# Patient Record
Sex: Male | Born: 1945 | Race: White | Hispanic: No | Marital: Single | Smoking: Never smoker
Health system: Southern US, Community
[De-identification: ages and names within clinical notes are randomized; demographics above are authoritative.]

## PROBLEM LIST (undated history)

## (undated) DIAGNOSIS — M19019 Primary osteoarthritis, unspecified shoulder: Secondary | ICD-10-CM

## (undated) DIAGNOSIS — T4145XA Adverse effect of unspecified anesthetic, initial encounter: Secondary | ICD-10-CM

## (undated) DIAGNOSIS — S46819A Strain of other muscles, fascia and tendons at shoulder and upper arm level, unspecified arm, initial encounter: Secondary | ICD-10-CM

## (undated) DIAGNOSIS — Z789 Other specified health status: Secondary | ICD-10-CM

## (undated) DIAGNOSIS — IMO0002 Reserved for concepts with insufficient information to code with codable children: Secondary | ICD-10-CM

## (undated) DIAGNOSIS — B019 Varicella without complication: Secondary | ICD-10-CM

## (undated) DIAGNOSIS — M7540 Impingement syndrome of unspecified shoulder: Secondary | ICD-10-CM

## (undated) DIAGNOSIS — M1990 Unspecified osteoarthritis, unspecified site: Secondary | ICD-10-CM

## (undated) DIAGNOSIS — C61 Malignant neoplasm of prostate: Secondary | ICD-10-CM

## (undated) DIAGNOSIS — T8859XA Other complications of anesthesia, initial encounter: Secondary | ICD-10-CM

## (undated) DIAGNOSIS — I10 Essential (primary) hypertension: Secondary | ICD-10-CM

## (undated) HISTORY — PX: PROSTATE BIOPSY: SHX241

## (undated) HISTORY — PX: OTHER SURGICAL HISTORY: SHX169

## (undated) HISTORY — PX: KNEE ARTHROSCOPY: SHX127

## (undated) HISTORY — DX: Varicella without complication: B01.9

## (undated) HISTORY — PX: COLONOSCOPY: SHX174

---
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
## 1974-09-07 HISTORY — PX: KNEE ARTHROPLASTY: SHX992

## 1989-09-07 HISTORY — PX: CERVICAL FUSION: SHX112

## 1992-09-07 HISTORY — PX: BUNIONECTOMY: SHX129

## 2004-09-25 ENCOUNTER — Encounter: Admission: RE | Admit: 2004-09-25 | Discharge: 2004-09-25 | Payer: Self-pay | Admitting: Family Medicine

## 2006-09-07 HISTORY — PX: SHOULDER SURGERY: SHX246

## 2008-10-23 ENCOUNTER — Encounter: Admission: RE | Admit: 2008-10-23 | Discharge: 2008-10-23 | Payer: Self-pay | Admitting: Family Medicine

## 2008-10-26 ENCOUNTER — Ambulatory Visit: Payer: Self-pay | Admitting: Internal Medicine

## 2008-11-06 ENCOUNTER — Ambulatory Visit: Payer: Self-pay | Admitting: Internal Medicine

## 2012-06-27 ENCOUNTER — Encounter (HOSPITAL_BASED_OUTPATIENT_CLINIC_OR_DEPARTMENT_OTHER): Payer: Self-pay | Admitting: *Deleted

## 2012-06-27 ENCOUNTER — Other Ambulatory Visit: Payer: Self-pay | Admitting: Orthopedic Surgery

## 2012-06-27 NOTE — Progress Notes (Signed)
No cardiac or resp problems 

## 2012-07-01 ENCOUNTER — Encounter (HOSPITAL_BASED_OUTPATIENT_CLINIC_OR_DEPARTMENT_OTHER): Payer: Self-pay | Admitting: Anesthesiology

## 2012-07-01 ENCOUNTER — Encounter (HOSPITAL_BASED_OUTPATIENT_CLINIC_OR_DEPARTMENT_OTHER)
Admission: RE | Disposition: A | Discharge status: Home / Self Care | Payer: Self-pay | Source: Ambulatory Visit | Attending: Orthopedic Surgery

## 2012-07-01 ENCOUNTER — Ambulatory Visit (HOSPITAL_BASED_OUTPATIENT_CLINIC_OR_DEPARTMENT_OTHER): Payer: BC Managed Care – PPO | Admitting: Anesthesiology

## 2012-07-01 ENCOUNTER — Encounter (HOSPITAL_BASED_OUTPATIENT_CLINIC_OR_DEPARTMENT_OTHER): Payer: Self-pay | Admitting: *Deleted

## 2012-07-01 ENCOUNTER — Ambulatory Visit (HOSPITAL_BASED_OUTPATIENT_CLINIC_OR_DEPARTMENT_OTHER)
Admission: RE | Admit: 2012-07-01 | Discharge: 2012-07-01 | Disposition: A | Discharge status: Home / Self Care | Payer: BC Managed Care – PPO | Source: Ambulatory Visit | Attending: Orthopedic Surgery | Admitting: Orthopedic Surgery

## 2012-07-01 DIAGNOSIS — M25819 Other specified joint disorders, unspecified shoulder: Secondary | ICD-10-CM | POA: Insufficient documentation

## 2012-07-01 DIAGNOSIS — S46819A Strain of other muscles, fascia and tendons at shoulder and upper arm level, unspecified arm, initial encounter: Secondary | ICD-10-CM

## 2012-07-01 DIAGNOSIS — M75120 Complete rotator cuff tear or rupture of unspecified shoulder, not specified as traumatic: Secondary | ICD-10-CM | POA: Insufficient documentation

## 2012-07-01 DIAGNOSIS — M19019 Primary osteoarthritis, unspecified shoulder: Secondary | ICD-10-CM

## 2012-07-01 DIAGNOSIS — M66329 Spontaneous rupture of flexor tendons, unspecified upper arm: Secondary | ICD-10-CM | POA: Insufficient documentation

## 2012-07-01 DIAGNOSIS — M7540 Impingement syndrome of unspecified shoulder: Secondary | ICD-10-CM | POA: Diagnosis present

## 2012-07-01 HISTORY — DX: Primary osteoarthritis, unspecified shoulder: M19.019

## 2012-07-01 HISTORY — DX: Reserved for concepts with insufficient information to code with codable children: IMO0002

## 2012-07-01 HISTORY — DX: Impingement syndrome of unspecified shoulder: M75.40

## 2012-07-01 HISTORY — DX: Strain of other muscles, fascia and tendons at shoulder and upper arm level, unspecified arm, initial encounter: S46.819A

## 2012-07-01 HISTORY — DX: Other specified health status: Z78.9

## 2012-07-01 HISTORY — DX: Unspecified osteoarthritis, unspecified site: M19.90

## 2012-07-01 SURGERY — SHOULDER ARTHROSCOPY WITH SUBACROMIAL DECOMPRESSION, ROTATOR CUFF REPAIR AND BICEP TENDON REPAIR
Anesthesia: General | Site: Shoulder | Laterality: Left | Wound class: Clean

## 2012-07-01 MED ORDER — DEXAMETHASONE SODIUM PHOSPHATE 4 MG/ML IJ SOLN
INTRAMUSCULAR | Status: DC | PRN
  Administered 2012-07-01: 10 mg via INTRAVENOUS

## 2012-07-01 MED ORDER — FENTANYL CITRATE 0.05 MG/ML IJ SOLN
50.0000 ug | INTRAMUSCULAR | Status: DC | PRN
  Administered 2012-07-01: 100 ug via INTRAVENOUS

## 2012-07-01 MED ORDER — LIDOCAINE HCL (CARDIAC) 20 MG/ML IV SOLN
INTRAVENOUS | Status: DC | PRN
  Administered 2012-07-01: 50 mg via INTRAVENOUS

## 2012-07-01 MED ORDER — METHOCARBAMOL 500 MG PO TABS: 500.0000 mg | ORAL_TABLET | Freq: Four times a day (QID) | ORAL | Status: DC

## 2012-07-01 MED ORDER — EPHEDRINE SULFATE 50 MG/ML IJ SOLN
INTRAMUSCULAR | Status: DC | PRN
  Administered 2012-07-01: 10 mg via INTRAVENOUS

## 2012-07-01 MED ORDER — LIDOCAINE HCL 4 % MT SOLN
OROMUCOSAL | Status: DC | PRN
  Administered 2012-07-01: 4 mL via TOPICAL

## 2012-07-01 MED ORDER — HYDROMORPHONE HCL PF 1 MG/ML IJ SOLN: 0.2500 mg | INTRAMUSCULAR | Status: DC | PRN

## 2012-07-01 MED ORDER — OXYCODONE HCL 5 MG/5ML PO SOLN: 5.0000 mg | Freq: Once | ORAL | Status: DC | PRN

## 2012-07-01 MED ORDER — DEXAMETHASONE SODIUM PHOSPHATE 10 MG/ML IJ SOLN
INTRAMUSCULAR | Status: DC | PRN
  Administered 2012-07-01: 10 mg

## 2012-07-01 MED ORDER — ONDANSETRON HCL 4 MG/2ML IJ SOLN: 4.0000 mg | Freq: Once | INTRAMUSCULAR | Status: DC | PRN

## 2012-07-01 MED ORDER — MIDAZOLAM HCL 2 MG/2ML IJ SOLN
1.0000 mg | INTRAMUSCULAR | Status: DC | PRN
  Administered 2012-07-01: 2 mg via INTRAVENOUS

## 2012-07-01 MED ORDER — OXYCODONE-ACETAMINOPHEN 10-325 MG PO TABS: 1.0000 | ORAL_TABLET | Freq: Four times a day (QID) | ORAL | Status: DC | PRN

## 2012-07-01 MED ORDER — SODIUM CHLORIDE 0.9 % IR SOLN
Status: DC | PRN
  Administered 2012-07-01: 15000 mL

## 2012-07-01 MED ORDER — CEFAZOLIN SODIUM-DEXTROSE 2-3 GM-% IV SOLR
2.0000 g | INTRAVENOUS | Status: AC
  Administered 2012-07-01: 2 g via INTRAVENOUS

## 2012-07-01 MED ORDER — OXYCODONE HCL 5 MG PO TABS: 5.0000 mg | ORAL_TABLET | Freq: Once | ORAL | Status: DC | PRN

## 2012-07-01 MED ORDER — BUPIVACAINE-EPINEPHRINE PF 0.5-1:200000 % IJ SOLN
INTRAMUSCULAR | Status: DC | PRN
  Administered 2012-07-01: 25 mL

## 2012-07-01 MED ORDER — ONDANSETRON HCL 4 MG/2ML IJ SOLN
INTRAMUSCULAR | Status: DC | PRN
  Administered 2012-07-01: 4 mg via INTRAVENOUS

## 2012-07-01 MED ORDER — PROMETHAZINE HCL 25 MG PO TABS: 25.0000 mg | ORAL_TABLET | Freq: Four times a day (QID) | ORAL | Status: DC | PRN

## 2012-07-01 MED ORDER — PROPOFOL 10 MG/ML IV BOLUS
INTRAVENOUS | Status: DC | PRN
  Administered 2012-07-01: 200 mg via INTRAVENOUS

## 2012-07-01 MED ORDER — LACTATED RINGERS IV SOLN
INTRAVENOUS | Status: DC
Start: 2012-07-01 — End: 2012-07-01
  Administered 2012-07-01 (×2): via INTRAVENOUS

## 2012-07-01 MED ORDER — SUCCINYLCHOLINE CHLORIDE 20 MG/ML IJ SOLN
INTRAMUSCULAR | Status: DC | PRN
  Administered 2012-07-01: 100 mg via INTRAVENOUS

## 2012-07-01 SURGICAL SUPPLY — 71 items
BLADE CUTTER GATOR 3.5 (BLADE) ×2 IMPLANT
BLADE GREAT WHITE 4.2 (BLADE) IMPLANT
BLADE SURG 15 STRL LF DISP TIS (BLADE) IMPLANT
BLADE SURG 15 STRL SS (BLADE)
BUR OVAL 4.0 (BURR) ×2 IMPLANT
BUR OVAL 6.0 (BURR) IMPLANT
CANISTER OMNI JUG 16 LITER (MISCELLANEOUS) ×4 IMPLANT
CANNULA 5.75X71 LONG (CANNULA) ×2 IMPLANT
CANNULA TWIST IN 8.25X7CM (CANNULA) ×2 IMPLANT
CLOTH BEACON ORANGE TIMEOUT ST (SAFETY) ×2 IMPLANT
CUTTER MENISCUS  4.2MM (BLADE)
CUTTER MENISCUS 4.2MM (BLADE) IMPLANT
DECANTER SPIKE VIAL GLASS SM (MISCELLANEOUS) IMPLANT
DRAPE INCISE IOBAN 66X45 STRL (DRAPES) ×2 IMPLANT
DRAPE SHOULDER BEACH CHAIR (DRAPES) ×2 IMPLANT
DRAPE U 20/CS (DRAPES) ×2 IMPLANT
DRAPE U-SHAPE 47X51 STRL (DRAPES) ×2 IMPLANT
DRSG PAD ABDOMINAL 8X10 ST (GAUZE/BANDAGES/DRESSINGS) ×4 IMPLANT
DURAPREP 26ML APPLICATOR (WOUND CARE) ×2 IMPLANT
ELECT REM PT RETURN 9FT ADLT (ELECTROSURGICAL) ×2
ELECTRODE REM PT RTRN 9FT ADLT (ELECTROSURGICAL) ×1 IMPLANT
FIBERSTICK 2 (SUTURE) IMPLANT
GLOVE BIO SURGEON STRL SZ7 (GLOVE) ×2 IMPLANT
GLOVE BIO SURGEON STRL SZ8 (GLOVE) ×2 IMPLANT
GLOVE BIOGEL PI IND STRL 7.0 (GLOVE) ×1 IMPLANT
GLOVE BIOGEL PI IND STRL 8 (GLOVE) ×2 IMPLANT
GLOVE BIOGEL PI INDICATOR 7.0 (GLOVE) ×1
GLOVE BIOGEL PI INDICATOR 8 (GLOVE) ×2
GLOVE ORTHO TXT STRL SZ7.5 (GLOVE) ×2 IMPLANT
GOWN BRE IMP PREV XXLGXLNG (GOWN DISPOSABLE) ×4 IMPLANT
IMMOBILIZER SHOULDER XLGE (ORTHOPEDIC SUPPLIES) IMPLANT
IV NS IRRIG 3000ML ARTHROMATIC (IV SOLUTION) ×12 IMPLANT
KIT BIO-TENODESIS 3X8 DISP (MISCELLANEOUS)
KIT INSRT BABSR STRL DISP BTN (MISCELLANEOUS) IMPLANT
KIT SHOULDER TRACTION (DRAPES) ×2 IMPLANT
LASSO SUT 90 DEGREE (SUTURE) IMPLANT
NDL SUT 6 .5 CRC .975X.05 MAYO (NEEDLE) IMPLANT
NEEDLE MAYO TAPER (NEEDLE)
PACK ARTHROSCOPY DSU (CUSTOM PROCEDURE TRAY) ×2 IMPLANT
PACK BASIN DAY SURGERY FS (CUSTOM PROCEDURE TRAY) ×2 IMPLANT
PENCIL BUTTON HOLSTER BLD 10FT (ELECTRODE) IMPLANT
SET ARTHROSCOPY TUBING (MISCELLANEOUS) ×1
SET ARTHROSCOPY TUBING LN (MISCELLANEOUS) ×1 IMPLANT
SHEET MEDIUM DRAPE 40X70 STRL (DRAPES) ×2 IMPLANT
SLEEVE SCD COMPRESS KNEE MED (MISCELLANEOUS) ×2 IMPLANT
SLING ARM FOAM STRAP LRG (SOFTGOODS) IMPLANT
SLING ARM FOAM STRAP MED (SOFTGOODS) IMPLANT
SLING ARM FOAM STRAP XLG (SOFTGOODS) ×2 IMPLANT
SLING ARM IMMOBILIZER LRG (SOFTGOODS) IMPLANT
SLING ARM IMMOBILIZER MED (SOFTGOODS) IMPLANT
SPONGE GAUZE 4X4 12PLY (GAUZE/BANDAGES/DRESSINGS) ×2 IMPLANT
SPONGE LAP 4X18 X RAY DECT (DISPOSABLE) IMPLANT
STRIP CLOSURE SKIN 1/2X4 (GAUZE/BANDAGES/DRESSINGS) ×2 IMPLANT
SUT FIBERWIRE #2 38 T-5 BLUE (SUTURE)
SUT LASSO 45 DEGREE (SUTURE) IMPLANT
SUT LASSO 45 DEGREE LEFT (SUTURE) IMPLANT
SUT LASSO 45D RIGHT (SUTURE) IMPLANT
SUT MNCRL AB 4-0 PS2 18 (SUTURE) ×2 IMPLANT
SUT PDS AB 1 CT  36 (SUTURE)
SUT PDS AB 1 CT 36 (SUTURE) IMPLANT
SUT PROLENE 0 CT 1 CR/8 (SUTURE) IMPLANT
SUT TIGER TAPE 7 IN WHITE (SUTURE) IMPLANT
SUT VIC AB 3-0 SH 27 (SUTURE)
SUT VIC AB 3-0 SH 27X BRD (SUTURE) IMPLANT
SUT VICRYL 3-0 CR8 SH (SUTURE) ×2 IMPLANT
SUTURE FIBERWR #2 38 T-5 BLUE (SUTURE) IMPLANT
TOWEL OR 17X24 6PK STRL BLUE (TOWEL DISPOSABLE) ×2 IMPLANT
TOWEL OR NON WOVEN STRL DISP B (DISPOSABLE) ×2 IMPLANT
TUBE CONNECTING 20X1/4 (TUBING) IMPLANT
WAND STAR VAC 90 (SURGICAL WAND) ×2 IMPLANT
WATER STERILE IRR 1000ML POUR (IV SOLUTION) ×2 IMPLANT

## 2012-07-01 NOTE — H&P (Signed)
   PREOPERATIVE H&P  Chief Complaint: IMPIGMENT SYNDROME LEFT SHOULDER, COMPLETE RUPTURE OF ROTATOR CUFF  HPI: Alan Hodges is a 66 y.o. male who presents for preoperative history and physical with a diagnosis of IMPIGMENT SYNDROME LEFT SHOULDER, COMPLETE RUPTURE OF ROTATOR CUFF. Symptoms are rated as moderate to severe, and have been worsening.  This is significantly impairing activities of daily living.  He has elected for surgical management.   Past Medical History  Diagnosis Date  . No pertinent past medical history   . Arthritis   . DDD (degenerative disc disease)   . DJD (degenerative joint disease)    Past Surgical History  Procedure Date  . Cervical fusion 1991  . Knee arthroplasty 1976    left open repair  . Knee arthroscopy 40,98,11    right kneex3  . Colonoscopy     x2  . Bunionectomy 1994    right   History   Social History  . Marital Status: Single    Spouse Name: N/A    Number of Children: N/A  . Years of Education: N/A   Social History Main Topics  . Smoking status: Never Smoker   . Smokeless tobacco: None  . Alcohol Use: Yes     daily  . Drug Use: No  . Sexually Active:    Other Topics Concern  . None   Social History Narrative  . None   History reviewed. No pertinent family history. No Known Allergies Prior to Admission medications   Medication Sig Start Date End Date Taking? Authorizing Provider  glucosamine-chondroitin 500-400 MG tablet Take 1 tablet by mouth 3 (three) times daily.   Yes Historical Provider, MD  Multiple Vitamins-Minerals (MULTIVITAMIN WITH MINERALS) tablet Take 1 tablet by mouth daily.   Yes Historical Provider, MD     Positive ROS: All other systems have been reviewed and were otherwise negative with the exception of those mentioned in the HPI and as above.  Physical Exam: General: Alert, no acute distress Cardiovascular: No pedal edema Respiratory: No cyanosis, no use of accessory musculature GI: No  organomegaly, abdomen is soft and non-tender Skin: No lesions in the area of chief complaint Neurologic: Sensation intact distally Psychiatric: Patient is competent for consent with normal mood and affect Lymphatic: No axillary or cervical lymphadenopathy  MUSCULOSKELETAL: Left shoulder has mild pain over the a.c. joint, positive liftoff test, pain anteriorly, weakness with subscapularis testing.  Assessment: IMPIGMENT SYNDROME LEFT SHOULDER, COMPLETE RUPTURE OF ROTATOR CUFF, subscapularis tear with biceps tendinopathy, question dislocation, a.c. joint arthritis  Plan: Plan for Procedure(s): SHOULDER ARTHROSCOPY WITH SUBACROMIAL DECOMPRESSION, ROTATOR CUFF REPAIR AND BICEP TENDON REPAIR, distal clavicle resection, subscapularis repair.  The risks benefits and alternatives were discussed with the patient including but not limited to the risks of nonoperative treatment, versus surgical intervention including infection, bleeding, nerve injury,  blood clots, cardiopulmonary complications, morbidity, mortality, among others, and they were willing to proceed.   Kerah Hardebeck P, MD Cell [phone] Pager [phone]  07/01/2012 7:28 AM

## 2012-07-01 NOTE — Op Note (Signed)
07/01/2012  10:02 AM  PATIENT:  Alan Hodges    PRE-OPERATIVE DIAGNOSIS:  Impingement Syndrome Left Shoulder; complete subscapularis tear, biceps tendon rupture, a.c. joint arthritis  POST-OPERATIVE DIAGNOSIS:  Same  PROCEDURE:  Left shoulder arthroscopy with extensive debridement of biceps tendon and rotator interval and subscapularis remnants, with acromioplasty and distal clavicle resection  SURGEON:  [name], MD  PHYSICIAN ASSISTANT: Janace Litten, OPA-C, present and scrubbed throughout the case, critical for completion in a timely fashion, and for retraction, instrumentation, and closure.  ANESTHESIA:   General  PREOPERATIVE INDICATIONS:  Alan Hodges is a  66 y.o. male a complaint of left shoulder pain, with weakness, difficulty using it. He failed conservative measures and had a preoperative MRI that demonstrated a complete subscapularis rupture with biceps tendon rupture, with retraction of the subscapularis 3 cm.  The risks benefits and alternatives were discussed with the patient preoperatively including but not limited to the risks of infection, bleeding, nerve injury, cardiopulmonary complications, the need for revision surgery, among others, and the patient was willing to proceed. We also did discuss the risks and that the tendon may not be repairable, and can have recurrent rupture, and incomplete relief of symptoms.  OPERATIVE IMPLANTS: None  OPERATIVE FINDINGS: The glenohumeral articular cartilage was in reasonably good condition, with some grade 1 maybe grade 2 changes on the humeral head. The biceps tendon was completely ruptured, and there was a stump of tissue floating within the joint. The subscapularis tendon was no where to be found. This had basically with her to wait to nothing, was severely retracted, and could not be seen, even after complete rotator interval release, dissection down to the coracoid, and mobilization of capsular tissue.  His shoulder  was actually remarkably strict stiff during examination under anesthesia, and he only went up to 160 passively. Even manipulation under anesthesia did not yield more motion.  He had moderate subacromial spurring with an intact rotator cuff superiorly and posteriorly. There was a little bit of fraying underneath the supraspinatus posteriorly, which was debrided. The a.c. joint had degenerative changes.  OPERATIVE PROCEDURE: The patient is brought to the operating room placed in supine position. General anesthesia was administered. IV antibiotics were given. Left upper extremity was examined and had limitations in motion with forward flexion. The left upper extremity was then prepped and draped in usual sterile fashion. Diagnostic was carried out the above-named findings. I used the arthroscopic shaver to debride the remnant stump of the biceps. I then used the ArthriCare to remove the rotator interval tissue down to the level of the coracoid. I mobilized the capsule, and where the subscapularis tendon should have been, however this was retracted, atrophic, and basically nonexistent. There is really no functional tendon to repair. I mobilized down well medial to the glenoid, and had exposure of the coracoid as well as the medial neck of the glenoid, and there was no quality tendon to work with. Therefore I debrided the stump that remained, and went to the subacromial space.  I performed a bursectomy, and the CA ligament release, and a mild acromioplasty. I then resected the distal clavicle removing about 1.1 cm. The rotator cuff was completely intact from the bursal side, although the subscapularis was ruptured as indicated above.  The arthroscopic instruments were then removed and the portals closed with Monocryl followed by Steri-Strips and sterile gauze. He tolerated this well and there were no complications.

## 2012-07-01 NOTE — Anesthesia Postprocedure Evaluation (Signed)
  Anesthesia Post-op Note  Patient: Alan Hodges  Procedure(s) Performed: Procedure(s) (LRB) with comments: SHOULDER ARTHROSCOPY WITH SUBACROMIAL DECOMPRESSION, ROTATOR CUFF REPAIR AND BICEP TENDON REPAIR (Left) - Left Shoulder Arthroscopy, Subacromial Decompression, Partial Acromioplasty with Coracoacromial Release; Debridement Rotator Cuff Tear and Supraspinatus Tear  Patient Location: PACU  Anesthesia Type: GA combined with regional for post-op pain  Level of Consciousness: Awake, alert and oriented  Airway and Oxygen Therapy: Patient Spontanous Breathing and Patient connected to face mask oxygen  Post-op Pain: none  Post-op Assessment: Post-op Vital signs reviewed  Post-op Vital Signs: Reviewed  Complications: No apparent anesthesia complications

## 2012-07-01 NOTE — Progress Notes (Signed)
Assisted Dr. Crews with left, ultrasound guided, interscalene  block. Side rails up, monitors on throughout procedure. See vital signs in flow sheet. Tolerated Procedure well. 

## 2012-07-01 NOTE — Anesthesia Preprocedure Evaluation (Signed)

## 2012-07-01 NOTE — Anesthesia Procedure Notes (Addendum)
Anesthesia Regional Block:  Interscalene brachial plexus block  Pre-Anesthetic Checklist: ,, timeout performed, Correct Patient, Correct Site, Correct Laterality, Correct Procedure, Correct Position, site marked, Risks and benefits discussed,  Surgical consent,  Pre-op evaluation,  At surgeon's request and post-op pain management  Laterality: Left and Upper  Prep: chloraprep       Needles:  Injection technique: Single-shot  Needle Type: Echogenic Needle     Needle Length: 5cm 5 cm Needle Gauge: 21    Additional Needles:  Procedures: ultrasound guided Interscalene brachial plexus block Narrative:  Start time: 07/01/2012 7:10 AM End time: 07/01/2012 7:15 AM Injection made incrementally with aspirations every 5 mL.  Performed by: Personally  Anesthesiologist: Sheldon Silvan  Supraclavicular block Procedure Name: Intubation Date/Time: 07/01/2012 7:46 AM Performed by: Burna Cash Pre-anesthesia Checklist: Patient identified, Emergency Drugs available, Suction available and Patient being monitored Patient Re-evaluated:Patient Re-evaluated prior to inductionOxygen Delivery Method: Circle System Utilized Preoxygenation: Pre-oxygenation with 100% oxygen Intubation Type: IV induction Ventilation: Mask ventilation without difficulty Laryngoscope Size: Mac and 3 Grade View: Grade I Tube type: Oral Tube size: 8.0 mm Number of attempts: 1 Airway Equipment and Method: stylet Placement Confirmation: ETT inserted through vocal cords under direct vision,  positive ETCO2 and breath sounds checked- equal and bilateral Secured at: 22 cm Tube secured with: Tape Dental Injury: Teeth and Oropharynx as per pre-operative assessment

## 2012-07-01 NOTE — Transfer of Care (Signed)
Immediate Anesthesia Transfer of Care Note  Patient: Alan Hodges  Procedure(s) Performed: Procedure(s) (LRB) with comments: SHOULDER ARTHROSCOPY WITH SUBACROMIAL DECOMPRESSION, ROTATOR CUFF REPAIR AND BICEP TENDON REPAIR (Left) - Left Shoulder Arthroscopy, Subacromial Decompression, Partial Acromioplasty with Coracoacromial Release with Rotator Cuff Repair  Patient Location: PACU  Anesthesia Type: GA combined with regional for post-op pain  Level of Consciousness: awake, alert  and oriented  Airway & Oxygen Therapy: Patient Spontanous Breathing and Patient connected to face mask oxygen  Post-op Assessment: Report given to PACU RN and Post -op Vital signs reviewed and stable  Post vital signs: Reviewed and stable  Complications: No apparent anesthesia complications

## 2012-07-06 ENCOUNTER — Encounter (HOSPITAL_BASED_OUTPATIENT_CLINIC_OR_DEPARTMENT_OTHER): Payer: Self-pay

## 2012-12-28 ENCOUNTER — Ambulatory Visit (INDEPENDENT_AMBULATORY_CARE_PROVIDER_SITE_OTHER): Payer: BC Managed Care – PPO | Admitting: Family Medicine

## 2012-12-28 ENCOUNTER — Encounter: Payer: Self-pay | Admitting: Family Medicine

## 2012-12-28 VITALS — BP 140/100 | HR 70 | Temp 98.4°F | Ht 72.0 in | Wt 202.0 lb

## 2012-12-28 DIAGNOSIS — IMO0002 Reserved for concepts with insufficient information to code with codable children: Secondary | ICD-10-CM

## 2012-12-28 DIAGNOSIS — R030 Elevated blood-pressure reading, without diagnosis of hypertension: Secondary | ICD-10-CM

## 2012-12-28 DIAGNOSIS — I10 Essential (primary) hypertension: Secondary | ICD-10-CM | POA: Insufficient documentation

## 2012-12-28 DIAGNOSIS — M170 Bilateral primary osteoarthritis of knee: Secondary | ICD-10-CM | POA: Insufficient documentation

## 2012-12-28 DIAGNOSIS — M1710 Unilateral primary osteoarthritis, unspecified knee: Secondary | ICD-10-CM

## 2012-12-28 DIAGNOSIS — N529 Male erectile dysfunction, unspecified: Secondary | ICD-10-CM | POA: Insufficient documentation

## 2012-12-28 DIAGNOSIS — IMO0001 Reserved for inherently not codable concepts without codable children: Secondary | ICD-10-CM

## 2012-12-28 NOTE — Patient Instructions (Addendum)
Try to avoid > 24 ounces beer per day Monitor blood pressure. Our goal is < 140/90. Consider repeat physical by this August.

## 2012-12-28 NOTE — Progress Notes (Signed)
  Subjective:    Patient ID: Alan Hodges, male    DOB: 02/26/1946, 67 y.o.   MRN: [id number]  HPI Here to establish care. Has previously seen another primary care physician in [place] for 33 years. Recently retired. Forensic scientist. Currently takes no medications. History of elevated blood pressure but not treated with medication. Blood pressures recent borderline elevated. Exercises fairly regularly with cycling. No recent chest pain or headaches.  He's had some problems with both knees with osteoarthritis. Multiple surgeries on both knees. Previous shoulder surgery for rotator cuff injury. Large Baker's cyst right knee  Nonsmoker. 3-4 beers per day. Retired earlier this year.  Family history significant for mother with hypertension and father with type 2 diabetes.  Past Medical History  Diagnosis Date  . No pertinent past medical history   . Arthritis   . DDD (degenerative disc disease)   . DJD (degenerative joint disease)   . Rupture of subscapularis tendon, irreparable 07/01/2012  . Shoulder impingement syndrome, left 07/01/2012  . 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 Chicken pox    Past Surgical History  Procedure Laterality Date  . Cervical fusion  1991  . Knee arthroplasty  1976    left open repair  . Knee arthroscopy  40,98,11    right kneex3  . Colonoscopy      x2  . Bunionectomy  1994    right  . Cataracts Bilateral     reports that he has never smoked. He does not have any smokeless tobacco history on file. He reports that  drinks alcohol. He reports that he does not use illicit drugs. family history includes Diabetes in his father and Hypertension in his mother. No Known Allergies       Review of Systems  Constitutional: Negative for fatigue.  Eyes: Negative for visual disturbance.  Respiratory: Negative for cough, chest tightness and shortness of breath.   Cardiovascular: Negative for chest pain, palpitations and leg swelling.   Musculoskeletal: Positive for arthralgias.  Neurological: Negative for dizziness, syncope, weakness, light-headedness and headaches.       Objective:   Physical Exam  Constitutional: He appears well-developed and well-nourished. No distress.  Neck: Neck supple. No thyromegaly present.  Cardiovascular: Normal rate and regular rhythm.  Exam reveals no gallop.   No murmur heard. Pulmonary/Chest: Effort normal and breath sounds normal. No respiratory distress. He has no wheezes. He has no rales.  Musculoskeletal: He exhibits no edema.  Large nontender Baker's cyst posterior right knee  Neurological: He is alert.          Assessment & Plan:  #1 osteoarthritis both knees. Continue regular cycling. #2 elevated blood pressure. Slightly up today. Continue regular exercise. Reassess at physical in August. Recommend scaling back alcohol to no more than 24 ounces of beer per day #3 history of large Baker's cyst right knee. Mostly asymptomatic. No intervention at this time.

## 2013-04-10 ENCOUNTER — Ambulatory Visit (INDEPENDENT_AMBULATORY_CARE_PROVIDER_SITE_OTHER): Payer: Medicare Other | Admitting: Family Medicine

## 2013-04-10 ENCOUNTER — Encounter: Payer: Self-pay | Admitting: Family Medicine

## 2013-04-10 VITALS — BP 142/82 | HR 72 | Temp 97.9°F | Ht 72.0 in | Wt 192.0 lb

## 2013-04-10 DIAGNOSIS — Z23 Encounter for immunization: Secondary | ICD-10-CM

## 2013-04-10 DIAGNOSIS — R972 Elevated prostate specific antigen [PSA]: Secondary | ICD-10-CM

## 2013-04-10 DIAGNOSIS — Z0000 Encounter for general adult medical examination without abnormal findings: Secondary | ICD-10-CM

## 2013-04-10 DIAGNOSIS — R030 Elevated blood-pressure reading, without diagnosis of hypertension: Secondary | ICD-10-CM

## 2013-04-10 LAB — CBC WITH DIFFERENTIAL/PLATELET
Eosinophils Relative: 3.4 % (ref 0.0–5.0)
HCT: 45.8 % (ref 39.0–52.0)
Hemoglobin: 15.5 g/dL (ref 13.0–17.0)
Lymphs Abs: 2.1 10*3/uL (ref 0.7–4.0)
MCV: 96.6 fl (ref 78.0–100.0)
Monocytes Absolute: 0.5 10*3/uL (ref 0.1–1.0)
Monocytes Relative: 9.1 % (ref 3.0–12.0)
Neutro Abs: 2.4 10*3/uL (ref 1.4–7.7)
Platelets: 252 10*3/uL (ref 150.0–400.0)
RDW: 13 % (ref 11.5–14.6)
WBC: 5.2 10*3/uL (ref 4.5–10.5)

## 2013-04-10 LAB — BASIC METABOLIC PANEL
BUN: 16 mg/dL (ref 6–23)
Calcium: 9.3 mg/dL (ref 8.4–10.5)
Creatinine, Ser: 1.1 mg/dL (ref 0.4–1.5)

## 2013-04-10 LAB — HEPATIC FUNCTION PANEL
AST: 24 U/L (ref 0–37)
Albumin: 4 g/dL (ref 3.5–5.2)
Alkaline Phosphatase: 70 U/L (ref 39–117)
Total Protein: 6.8 g/dL (ref 6.0–8.3)

## 2013-04-10 LAB — LIPID PANEL
Cholesterol: 206 mg/dL — ABNORMAL HIGH (ref 0–200)
Total CHOL/HDL Ratio: 4
Triglycerides: 85 mg/dL (ref 0.0–149.0)

## 2013-04-10 LAB — TSH: TSH: 1.76 u[IU]/mL (ref 0.35–5.50)

## 2013-04-10 LAB — PSA: PSA: 3.99 ng/mL (ref 0.10–4.00)

## 2013-04-10 NOTE — Patient Instructions (Addendum)
Herpes Zoster Virus Vaccine What is this medicine? HERPES ZOSTER VIRUS VACCINE (HUR peez ZOS ter vahy ruhs vak SEEN) is a vaccine. It is used to prevent shingles in adults 65 years old and over. This vaccine is not used to treat shingles or nerve pain from shingles. This medicine may be used for other purposes; ask your health care provider or pharmacist if you have questions. What should I tell my health care provider before I take this medicine? They need to know if you have any of these conditions: -cancer like leukemia or lymphoma -immune system problems or therapy -infection with fever -tuberculosis -an unusual or allergic reaction to vaccines, neomycin, gelatin, other medicines, foods, dyes, or preservatives -pregnant or trying to get pregnant -breast-feeding How should I use this medicine? This vaccine is for injection under the skin. It is given by a health care professional. Talk to your pediatrician regarding the use of this medicine in children. This medicine is not approved for use in children. Overdosage: If you think you have taken too much of this medicine contact a poison control center or emergency room at once. NOTE: This medicine is only for you. Do not share this medicine with others. What if I miss a dose? This does not apply. What may interact with this medicine? Do not take this medicine with any of the following medications: -adalimumab -anakinra -etanercept -infliximab -medicines to treat cancer -medicines that suppress your immune system This medicine may also interact with the following medications: -immunoglobulins -steroid medicines like prednisone or cortisone This list may not describe all possible interactions. Give your health care provider a list of all the medicines, herbs, non-prescription drugs, or dietary supplements you use. Also tell them if you smoke, drink alcohol, or use illegal drugs. Some items may interact with your medicine. What should I  watch for while using this medicine? Visit your doctor for regular check ups. This vaccine, like all vaccines, may not fully protect everyone. After receiving this vaccine it may be possible to pass chickenpox infection to others. Avoid people with immune system problems, pregnant women who have not had chickenpox, and newborns of women who have not had chickenpox. Talk to your doctor for more information. What side effects may I notice from receiving this medicine? Side effects that you should report to your doctor or health care professional as soon as possible: -allergic reactions like skin rash, itching or hives, swelling of the face, lips, or tongue -breathing problems -feeling faint or lightheaded, falls -fever, flu-like symptoms -pain, tingling, numbness in the hands or feet -swelling of the ankles, feet, hands -unusually weak or tired Side effects that usually do not require medical attention (report to your doctor or health care professional if they continue or are bothersome): -aches or pains -chickenpox-like rash -diarrhea -headache -loss of appetite -nausea, vomiting -redness, pain, swelling at site where injected -runny nose This list may not describe all possible side effects. Call your doctor for medical advice about side effects. You may report side effects to FDA at 1-800-FDA-1088. Where should I keep my medicine? This drug is given in a hospital or clinic and will not be stored at home. NOTE: This sheet is a summary. It may not cover all possible information. If you have questions about this medicine, talk to your doctor, pharmacist, or health care provider.  2013, [employer]. (02/10/2010 5:43:50 PM)  

## 2013-04-10 NOTE — Progress Notes (Signed)
Subjective:    Patient ID: Alan Hodges, male    DOB: [birth year], 67 y.o.   MRN: [id number]  HPI Patient here for complete physical Generally very healthy He had orthopedic issues including bilateral knee osteoarthritis and previous neck surgery and rotator cuff surgery. He stays very active. He cycles about 100 miles per week.  Previous colonoscopy March 2010. Last tetanus estimated 6 years ago. No history of shingles vaccine. Requesting this today. Also no history of Pneumovax.  Past Medical History  Diagnosis Date  . No pertinent past medical history   . Arthritis   . DDD (degenerative disc disease)   . DJD (degenerative joint disease)   . Rupture of subscapularis tendon, irreparable 07/01/2012  . Shoulder impingement syndrome, left 07/01/2012  . 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 Chicken pox    Past Surgical History  Procedure Laterality Date  . Cervical fusion  1991  . Knee arthroplasty  1976    left open repair  . Knee arthroscopy  95,62,13    right kneex3  . Colonoscopy      x2  . Bunionectomy  1994    right  . Cataracts Bilateral     reports that he has never smoked. He does not have any smokeless tobacco history on file. He reports that  drinks alcohol. He reports that he does not use illicit drugs. family history includes Diabetes in his father and Hypertension in his mother. No Known Allergies    Review of Systems  Constitutional: Negative for fever, activity change, appetite change and fatigue.  HENT: Negative for ear pain, congestion and trouble swallowing.   Eyes: Negative for pain and visual disturbance.  Respiratory: Negative for cough, shortness of breath and wheezing.   Cardiovascular: Negative for chest pain and palpitations.  Gastrointestinal: Negative for nausea, vomiting, abdominal pain, diarrhea, constipation, blood in stool, abdominal distention and rectal pain.  Genitourinary: Negative for dysuria, hematuria and testicular  pain.  Musculoskeletal: Positive for arthralgias. Negative for joint swelling.  Skin: Negative for rash.  Neurological: Negative for dizziness, syncope and headaches.  Hematological: Negative for adenopathy.  Psychiatric/Behavioral: Negative for confusion and dysphoric mood.       Objective:   Physical Exam  Constitutional: He is oriented to person, place, and time. He appears well-developed and well-nourished. No distress.  HENT:  Head: Normocephalic and atraumatic.  Right Ear: External ear normal.  Left Ear: External ear normal.  Mouth/Throat: Oropharynx is clear and moist.  Eyes: Conjunctivae and EOM are normal. Pupils are equal, round, and reactive to light.  Neck: Normal range of motion. Neck supple. No thyromegaly present.  Cardiovascular: Normal rate, regular rhythm and normal heart sounds.   No murmur heard. Pulmonary/Chest: No respiratory distress. He has no wheezes. He has no rales.  Abdominal: Soft. Bowel sounds are normal. He exhibits no distension and no mass. There is no tenderness. There is no rebound and no guarding.  Genitourinary:  Prostate minimally enlarged. No nodules. Nontender.  Musculoskeletal: He exhibits no edema.  Lymphadenopathy:    He has no cervical adenopathy.  Neurological: He is alert and oriented to person, place, and time. He displays normal reflexes. No cranial nerve deficit.  Skin: No rash noted.  Psychiatric: He has a normal mood and affect.          Assessment & Plan:  Complete physical. Pneumovax given. Patient requesting shingles vaccine. No contraindications. Obtain screening lab work. Continue regular exercise habits. He has already lost 10 pounds since last visit  and he attributes to exercise. Confirm date of last tetanus. Colonoscopy up to date. He is requesting PSA after discussion of pros and cons

## 2013-04-11 ENCOUNTER — Encounter: Payer: Self-pay | Admitting: Family Medicine

## 2013-04-11 NOTE — Addendum Note (Signed)
Addended by: Kristian Covey on: 04/11/2013 08:11 AM   Modules accepted: Orders

## 2013-07-13 ENCOUNTER — Other Ambulatory Visit: Payer: Self-pay

## 2013-10-17 ENCOUNTER — Other Ambulatory Visit (INDEPENDENT_AMBULATORY_CARE_PROVIDER_SITE_OTHER): Payer: Medicare Other

## 2013-10-17 DIAGNOSIS — R972 Elevated prostate specific antigen [PSA]: Secondary | ICD-10-CM

## 2013-10-17 LAB — PSA: PSA: 3.47 ng/mL (ref 0.10–4.00)

## 2013-10-18 ENCOUNTER — Other Ambulatory Visit: Payer: Self-pay | Admitting: Family Medicine

## 2013-10-18 DIAGNOSIS — R972 Elevated prostate specific antigen [PSA]: Secondary | ICD-10-CM

## 2014-04-11 ENCOUNTER — Encounter: Payer: Medicare Other | Admitting: Family Medicine

## 2014-05-02 ENCOUNTER — Ambulatory Visit (INDEPENDENT_AMBULATORY_CARE_PROVIDER_SITE_OTHER): Payer: Medicare Other | Admitting: Family Medicine

## 2014-05-02 ENCOUNTER — Encounter: Payer: Self-pay | Admitting: Family Medicine

## 2014-05-02 VITALS — BP 140/80 | HR 70 | Temp 97.7°F | Ht 72.0 in | Wt 186.0 lb

## 2014-05-02 DIAGNOSIS — Z0000 Encounter for general adult medical examination without abnormal findings: Secondary | ICD-10-CM

## 2014-05-02 LAB — BASIC METABOLIC PANEL
BUN: 20 mg/dL (ref 6–23)
CHLORIDE: 106 meq/L (ref 96–112)
CO2: 24 mEq/L (ref 19–32)
Calcium: 9 mg/dL (ref 8.4–10.5)
Creatinine, Ser: 0.9 mg/dL (ref 0.4–1.5)
GFR: 85.31 mL/min (ref >60.00)
Glucose, Bld: 90 mg/dL (ref 70–99)
POTASSIUM: 3.9 meq/L (ref 3.5–5.1)
SODIUM: 138 meq/L (ref 135–145)

## 2014-05-02 LAB — CBC WITH DIFFERENTIAL/PLATELET
BASOS PCT: 0.4 % (ref 0.0–3.0)
Basophils Absolute: 0 10*3/uL (ref 0.0–0.1)
EOS ABS: 0.2 10*3/uL (ref 0.0–0.7)
Eosinophils Relative: 3.4 % (ref 0.0–5.0)
HCT: 43 % (ref 39.0–52.0)
Hemoglobin: 14.6 g/dL (ref 13.0–17.0)
LYMPHS ABS: 2.1 10*3/uL (ref 0.7–4.0)
Lymphocytes Relative: 38.6 % (ref 12.0–46.0)
MCHC: 33.8 g/dL (ref 30.0–36.0)
MCV: 95.3 fl (ref 78.0–100.0)
MONO ABS: 0.4 10*3/uL (ref 0.1–1.0)
Monocytes Relative: 7.6 % (ref 3.0–12.0)
NEUTROS PCT: 50 % (ref 43.0–77.0)
Neutro Abs: 2.7 10*3/uL (ref 1.4–7.7)
PLATELETS: 240 10*3/uL (ref 150.0–400.0)
RBC: 4.51 Mil/uL (ref 4.22–5.81)
RDW: 12.8 % (ref 11.5–15.5)
WBC: 5.3 10*3/uL (ref 4.0–10.5)

## 2014-05-02 LAB — PSA: PSA: 4.23 ng/mL — AB (ref 0.10–4.00)

## 2014-05-02 LAB — LIPID PANEL
CHOLESTEROL: 195 mg/dL (ref 0–200)
HDL: 52.1 mg/dL (ref >39.00)
LDL CALC: 118 mg/dL — AB (ref 0–99)
NonHDL: 142.9
TRIGLYCERIDES: 125 mg/dL (ref 0.0–149.0)
Total CHOL/HDL Ratio: 4
VLDL: 25 mg/dL (ref 0.0–40.0)

## 2014-05-02 LAB — HEPATIC FUNCTION PANEL
ALBUMIN: 3.8 g/dL (ref 3.5–5.2)
ALT: 17 U/L (ref 0–53)
AST: 21 U/L (ref 0–37)
Alkaline Phosphatase: 60 U/L (ref 39–117)
BILIRUBIN TOTAL: 1 mg/dL (ref 0.2–1.2)
Bilirubin, Direct: 0.1 mg/dL (ref 0.0–0.3)
Total Protein: 6.6 g/dL (ref 6.0–8.3)

## 2014-05-02 LAB — TSH: TSH: 1.56 u[IU]/mL (ref 0.35–4.50)

## 2014-05-02 NOTE — Patient Instructions (Signed)
Remember flu vaccine this Fall.   

## 2014-05-02 NOTE — Progress Notes (Signed)
Subjective:    Patient ID: Alan Hodges, male    DOB: Nov 11, 1945, 68 y.o.   MRN: [id number]  HPI Patient here for complete physical. Generally very healthy. He cycles about 100 miles week. No recent chest pains. No recent injuries. Tetanus 2008. Colonoscopy 2010. He received shingles vaccine and pneumococcal vaccine last year. He plans to get flu vaccine later this fall. Takes no regular medications. 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 Pain is not a major limiting factor at this time  Past Medical History  Diagnosis Date  . No pertinent past medical history   . Arthritis   . DDD (degenerative disc disease)   . DJD (degenerative joint disease)   . Rupture of subscapularis tendon, irreparable 07/01/2012  . Shoulder impingement syndrome, left 07/01/2012  . 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 Chicken pox    Past Surgical History  Procedure Laterality Date  . Cervical fusion  1991  . Knee arthroplasty  1976    left open repair  . Knee arthroscopy  30,09,23    right kneex3  . Colonoscopy      x2  . Bunionectomy  1994    right  . Cataracts Bilateral     reports that he has never smoked. He does not have any smokeless tobacco history on file. He reports that he drinks alcohol. He reports that he does not use illicit drugs. family history includes Diabetes in his father; Hypertension in his mother. No Known Allergies    Review of Systems  Constitutional: Negative for fever, activity change, appetite change and fatigue.  HENT: Negative for congestion, ear pain and trouble swallowing.   Eyes: Negative for pain and visual disturbance.  Respiratory: Negative for cough, shortness of breath and wheezing.   Cardiovascular: Negative for chest pain and palpitations.  Gastrointestinal: Negative for nausea, vomiting, abdominal pain, diarrhea, constipation, blood in stool, abdominal distention and  rectal pain.  Genitourinary: Negative for dysuria, hematuria and testicular pain.  Musculoskeletal: Negative for arthralgias and joint swelling.  Skin: Negative for rash.  Neurological: Negative for dizziness, syncope and headaches.  Hematological: Negative for adenopathy.  Psychiatric/Behavioral: Negative for confusion and dysphoric mood.       Objective:   Physical Exam  Constitutional: He is oriented to person, place, and time. He appears well-developed and well-nourished. No distress.  HENT:  Head: Normocephalic and atraumatic.  Right Ear: External ear normal.  Left Ear: External ear normal.  Mouth/Throat: Oropharynx is clear and moist.  Eyes: Conjunctivae and EOM are normal. Pupils are equal, round, and reactive to light.  Neck: Normal range of motion. Neck supple. No thyromegaly present.  Cardiovascular: Normal rate, regular rhythm and normal heart sounds.   No murmur heard. Pulmonary/Chest: No respiratory distress. He has no wheezes. He has no rales.  Abdominal: Soft. Bowel sounds are normal. He exhibits no distension and no mass. There is no tenderness. There is no rebound and no guarding.  Genitourinary:  Prostate is slightly enlarged but symmetric with no nodules noted. No rectal mass  Musculoskeletal: He exhibits no edema.  Lymphadenopathy:    He has no cervical adenopathy.  Neurological: He is alert and oriented to person, place, and time. He displays normal reflexes. No cranial nerve deficit.  Skin: No rash noted.  Psychiatric: He has a normal mood and affect.          Assessment & Plan:  Complete physical. Tetanus booster in 2 years.  He wishes to return later for flu vaccine. Colonoscopy 2010. Received previous pneumonia vaccine and shingles vaccine. Continue regular exercise habits. Obtain screening labs. Patient requests PSA after discussion of risk and benefits.

## 2014-05-02 NOTE — Progress Notes (Signed)
Pre visit review using our clinic review tool, if applicable. No additional management support is needed unless otherwise documented below in the visit note. 

## 2014-06-15 ENCOUNTER — Encounter: Payer: Self-pay | Admitting: Internal Medicine

## 2014-08-27 ENCOUNTER — Other Ambulatory Visit (INDEPENDENT_AMBULATORY_CARE_PROVIDER_SITE_OTHER): Payer: Medicare Other

## 2014-08-27 DIAGNOSIS — R972 Elevated prostate specific antigen [PSA]: Secondary | ICD-10-CM

## 2014-08-28 LAB — PSA: PSA: 4.79 ng/mL — AB (ref 0.10–4.00)

## 2014-08-29 ENCOUNTER — Telehealth: Payer: Self-pay | Admitting: *Deleted

## 2014-08-29 NOTE — Telephone Encounter (Signed)
Spoke with patient and he will call urology.

## 2015-01-29 ENCOUNTER — Other Ambulatory Visit: Payer: Self-pay | Admitting: Urology

## 2015-01-29 DIAGNOSIS — C61 Malignant neoplasm of prostate: Secondary | ICD-10-CM

## 2015-03-28 ENCOUNTER — Ambulatory Visit (INDEPENDENT_AMBULATORY_CARE_PROVIDER_SITE_OTHER)
Admission: RE | Admit: 2015-03-28 | Discharge: 2015-03-28 | Disposition: A | Discharge status: Home / Self Care | Payer: Medicare Other | Source: Ambulatory Visit | Attending: Family Medicine | Admitting: Family Medicine

## 2015-03-28 ENCOUNTER — Encounter: Payer: Self-pay | Admitting: Family Medicine

## 2015-03-28 ENCOUNTER — Ambulatory Visit (INDEPENDENT_AMBULATORY_CARE_PROVIDER_SITE_OTHER): Payer: Medicare Other | Admitting: Family Medicine

## 2015-03-28 VITALS — BP 130/90 | HR 68 | Temp 98.3°F | Wt 182.0 lb

## 2015-03-28 DIAGNOSIS — M25572 Pain in left ankle and joints of left foot: Secondary | ICD-10-CM

## 2015-03-28 NOTE — Progress Notes (Signed)
   Subjective:    Patient ID: Alan Hodges, male    DOB: Nov 20, 1945, 69 y.o.   MRN: [id number]  HPI Acute visit for left ankle pain. Injury occurred on Tuesday evening. His girlfriend accidentally stepped on his ankle and rotated with pressure onto his lower leg and ankle. He describes probable eversion type injury. He has had some lateral ankle tenderness since then and tenderness over the distal fibula. He is walking with a limp. He has been taking ibuprofen and icing and elevating. Ace wrap for compression. Denies any medial ankle pain  Past Medical History  Diagnosis Date  . No pertinent past medical history   . Arthritis   . DDD (degenerative disc disease)   . DJD (degenerative joint disease)   . Rupture of subscapularis tendon, irreparable 07/01/2012  . Shoulder impingement syndrome, left 07/01/2012  . 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 Chicken pox    Past Surgical History  Procedure Laterality Date  . Cervical fusion  1991  . Knee arthroplasty  1976    left open repair  . Knee arthroscopy  10,93,23    right kneex3  . Colonoscopy      x2  . Bunionectomy  1994    right  . Cataracts Bilateral     reports that he has never smoked. He does not have any smokeless tobacco history on file. He reports that he drinks alcohol. He reports that he does not use illicit drugs. family history includes Diabetes in his father; Hypertension in his mother. No Known Allergies    Review of Systems  Neurological: Negative for weakness and numbness.       Objective:   Physical Exam  Constitutional: He appears well-developed and well-nourished.  Cardiovascular: Normal rate and regular rhythm.   Pulmonary/Chest: Effort normal and breath sounds normal. No respiratory distress. He has no wheezes. He has no rales.  Musculoskeletal:  Left ankle reveals some swelling laterally with some ecchymosis along the inferior aspect of ankle down to the lateral aspect of the left  foot. No Achilles tenderness. No medial tenderness. Tenderness over the distal fibula. He has pain with eversion and inversion. He is able to plantarflex and dorsiflex without difficulty He does not have any tenderness over the proximal left fifth metatarsal          Assessment & Plan:  Left ankle pain following injury. He has tenderness over the distal fibula and needs x-ray for further evaluation. Rule out distal fibular fracture Order placed. Continue ice, elevation, ibuprofen, and Ace wrap

## 2015-03-28 NOTE — Progress Notes (Signed)
Pre visit review using our clinic review tool, if applicable. No additional management support is needed unless otherwise documented below in the visit note. 

## 2015-03-28 NOTE — Patient Instructions (Signed)

## 2015-03-29 ENCOUNTER — Ambulatory Visit (HOSPITAL_COMMUNITY)
Admission: RE | Admit: 2015-03-29 | Discharge: 2015-03-29 | Disposition: A | Discharge status: Home / Self Care | Payer: Medicare Other | Source: Ambulatory Visit | Attending: Urology | Admitting: Urology

## 2015-03-29 DIAGNOSIS — R972 Elevated prostate specific antigen [PSA]: Secondary | ICD-10-CM | POA: Diagnosis not present

## 2015-03-29 DIAGNOSIS — C61 Malignant neoplasm of prostate: Secondary | ICD-10-CM | POA: Insufficient documentation

## 2015-03-29 LAB — POCT I-STAT CREATININE: Creatinine, Ser: 1 mg/dL (ref 0.61–1.24)

## 2015-03-29 MED ORDER — GADOBENATE DIMEGLUMINE 529 MG/ML IV SOLN
20.0000 mL | Freq: Once | INTRAVENOUS | Status: AC | PRN
  Administered 2015-03-29: 17 mL via INTRAVENOUS

## 2015-09-27 ENCOUNTER — Ambulatory Visit (INDEPENDENT_AMBULATORY_CARE_PROVIDER_SITE_OTHER): Payer: Medicare Other | Admitting: Family Medicine

## 2015-09-27 ENCOUNTER — Encounter: Payer: Self-pay | Admitting: Family Medicine

## 2015-09-27 VITALS — BP 130/94 | HR 77 | Temp 97.8°F | Ht 71.25 in | Wt 192.0 lb

## 2015-09-27 DIAGNOSIS — C61 Malignant neoplasm of prostate: Secondary | ICD-10-CM | POA: Diagnosis not present

## 2015-09-27 DIAGNOSIS — Z23 Encounter for immunization: Secondary | ICD-10-CM | POA: Diagnosis not present

## 2015-09-27 DIAGNOSIS — Z0000 Encounter for general adult medical examination without abnormal findings: Secondary | ICD-10-CM | POA: Diagnosis not present

## 2015-09-27 LAB — HEPATIC FUNCTION PANEL
ALBUMIN: 4.2 g/dL (ref 3.5–5.2)
ALK PHOS: 65 U/L (ref 39–117)
ALT: 19 U/L (ref 0–53)
AST: 23 U/L (ref 0–37)
BILIRUBIN TOTAL: 1.1 mg/dL (ref 0.2–1.2)
Bilirubin, Direct: 0.2 mg/dL (ref 0.0–0.3)
Total Protein: 6.8 g/dL (ref 6.0–8.3)

## 2015-09-27 LAB — CBC WITH DIFFERENTIAL/PLATELET
Basophils Absolute: 0 10*3/uL (ref 0.0–0.1)
Basophils Relative: 0.3 % (ref 0.0–3.0)
EOS ABS: 0.1 10*3/uL (ref 0.0–0.7)
EOS PCT: 1.7 % (ref 0.0–5.0)
HCT: 45.8 % (ref 39.0–52.0)
HEMOGLOBIN: 15.3 g/dL (ref 13.0–17.0)
LYMPHS ABS: 2.5 10*3/uL (ref 0.7–4.0)
Lymphocytes Relative: 38 % (ref 12.0–46.0)
MCHC: 33.5 g/dL (ref 30.0–36.0)
MCV: 94.8 fl (ref 78.0–100.0)
MONO ABS: 0.5 10*3/uL (ref 0.1–1.0)
Monocytes Relative: 7.2 % (ref 3.0–12.0)
NEUTROS PCT: 52.8 % (ref 43.0–77.0)
Neutro Abs: 3.5 10*3/uL (ref 1.4–7.7)
Platelets: 263 10*3/uL (ref 150.0–400.0)
RBC: 4.82 Mil/uL (ref 4.22–5.81)
RDW: 12.5 % (ref 11.5–15.5)
WBC: 6.6 10*3/uL (ref 4.0–10.5)

## 2015-09-27 LAB — LIPID PANEL
CHOLESTEROL: 203 mg/dL — AB (ref 0–200)
HDL: 53.7 mg/dL (ref >39.00)
LDL CALC: 129 mg/dL — AB (ref 0–99)
NONHDL: 149.06
Total CHOL/HDL Ratio: 4
Triglycerides: 100 mg/dL (ref 0.0–149.0)
VLDL: 20 mg/dL (ref 0.0–40.0)

## 2015-09-27 LAB — BASIC METABOLIC PANEL
BUN: 18 mg/dL (ref 6–23)
CALCIUM: 9.2 mg/dL (ref 8.4–10.5)
CO2: 28 meq/L (ref 19–32)
CREATININE: 1.06 mg/dL (ref 0.40–1.50)
Chloride: 102 mEq/L (ref 96–112)
GFR: 73.95 mL/min (ref >60.00)
Glucose, Bld: 87 mg/dL (ref 70–99)
Potassium: 3.9 mEq/L (ref 3.5–5.1)
SODIUM: 139 meq/L (ref 135–145)

## 2015-09-27 LAB — PSA: PSA: 5.19 ng/mL — AB (ref 0.10–4.00)

## 2015-09-27 LAB — TSH: TSH: 1.33 u[IU]/mL (ref 0.35–4.50)

## 2015-09-27 NOTE — Progress Notes (Signed)
Pre visit review using our clinic review tool, if applicable. No additional management support is needed unless otherwise documented below in the visit note. 

## 2015-09-27 NOTE — Progress Notes (Signed)
   Subjective:    Patient ID: Alan Hodges, male    DOB: 20-Feb-1946, 70 y.o.   MRN: [id number]  HPI Patient here for physical exam. Last year was diagnosed with prostate cancer. His last biopsy was last July. Gleason score 6. He takes no medications. He exercises almost daily. Already had flu vaccine. Needs Prevnar 13. No chest pains. No dysuria. No change in stool habits. Colonoscopy up-to-date.  Past Medical History  Diagnosis Date  . No pertinent past medical history   . Arthritis   . DDD (degenerative disc disease)   . DJD (degenerative joint disease)   . Rupture of subscapularis tendon, irreparable 07/01/2012  . Shoulder impingement syndrome, left 07/01/2012  . 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 Chicken pox    Past Surgical History  Procedure Laterality Date  . Cervical fusion  1991  . Knee arthroplasty  1976    left open repair  . Knee arthroscopy  [id number]    right kneex3  . Colonoscopy      x2  . Bunionectomy  1994    right  . Cataracts Bilateral     reports that he has never smoked. He does not have any smokeless tobacco history on file. He reports that he drinks alcohol. He reports that he does not use illicit drugs. family history includes Diabetes in his father; Hypertension in his mother. No Known Allergies    Review of Systems  Constitutional: Negative for fever, activity change, appetite change and fatigue.  HENT: Negative for congestion, ear pain and trouble swallowing.   Eyes: Negative for pain and visual disturbance.  Respiratory: Negative for cough, shortness of breath and wheezing.   Cardiovascular: Negative for chest pain and palpitations.  Gastrointestinal: Negative for nausea, vomiting, abdominal pain, diarrhea, constipation, blood in stool, abdominal distention and rectal pain.  Genitourinary: Negative for dysuria, hematuria and testicular pain.  Musculoskeletal: Positive for arthralgias. Negative for joint swelling.  Skin:  Negative for rash.  Neurological: Negative for dizziness, syncope and headaches.  Hematological: Negative for adenopathy.  Psychiatric/Behavioral: Negative for confusion and dysphoric mood.       Objective:   Physical Exam  Constitutional: He is oriented to person, place, and time. He appears well-developed and well-nourished. No distress.  HENT:  Head: Normocephalic and atraumatic.  Right Ear: External ear normal.  Left Ear: External ear normal.  Mouth/Throat: Oropharynx is clear and moist.  Eyes: Conjunctivae and EOM are normal. Pupils are equal, round, and reactive to light.  Neck: Normal range of motion. Neck supple. No thyromegaly present.  Cardiovascular: Normal rate, regular rhythm and normal heart sounds.   No murmur heard. Pulmonary/Chest: No respiratory distress. He has no wheezes. He has no rales.  Abdominal: Soft. Bowel sounds are normal. He exhibits no distension and no mass. There is no tenderness. There is no rebound and no guarding.  Musculoskeletal: He exhibits no edema.  Lymphadenopathy:    He has no cervical adenopathy.  Neurological: He is alert and oriented to person, place, and time. He displays normal reflexes. No cranial nerve deficit.  Skin: No rash noted.  Psychiatric: He has a normal mood and affect.          Assessment & Plan:  Physical exam. Prevnar 13 given. Obtain screening lab work. Patient requesting repeat PSA. We will fax results to his urologist. Continue regular exercise habits. Blood pressure borderline elevated today. Monitor closely and be in touch if consistent readings over 150/90

## 2015-09-27 NOTE — Patient Instructions (Signed)
Monitor blood pressure and be in touch if consistently > 150/90 

## 2015-11-29 DIAGNOSIS — N400 Enlarged prostate without lower urinary tract symptoms: Secondary | ICD-10-CM | POA: Diagnosis not present

## 2015-11-29 DIAGNOSIS — C61 Malignant neoplasm of prostate: Secondary | ICD-10-CM | POA: Diagnosis not present

## 2015-11-29 DIAGNOSIS — Z0000 Encounter for general adult medical examination without abnormal findings: Secondary | ICD-10-CM | POA: Diagnosis not present

## 2016-05-28 DIAGNOSIS — H524 Presbyopia: Secondary | ICD-10-CM | POA: Diagnosis not present

## 2016-05-29 DIAGNOSIS — C61 Malignant neoplasm of prostate: Secondary | ICD-10-CM | POA: Diagnosis not present

## 2016-06-03 DIAGNOSIS — C61 Malignant neoplasm of prostate: Secondary | ICD-10-CM | POA: Diagnosis not present

## 2016-08-04 ENCOUNTER — Ambulatory Visit (INDEPENDENT_AMBULATORY_CARE_PROVIDER_SITE_OTHER): Payer: Medicare Other

## 2016-08-04 VITALS — BP 140/90 | HR 70 | Ht 72.0 in | Wt 191.5 lb

## 2016-08-04 DIAGNOSIS — Z0000 Encounter for general adult medical examination without abnormal findings: Secondary | ICD-10-CM

## 2016-08-04 DIAGNOSIS — Z7289 Other problems related to lifestyle: Secondary | ICD-10-CM | POA: Diagnosis not present

## 2016-08-04 DIAGNOSIS — Z23 Encounter for immunization: Secondary | ICD-10-CM | POA: Diagnosis not present

## 2016-08-04 NOTE — Progress Notes (Signed)
Subjective:   Alan Hodges is a 70 y.o. male who presents for an Initial Medicare Annual Wellness Visit.  The Patient was informed that the wellness visit is to identify future health risk and educate and initiate measures that can reduce risk for increased disease through the lifespan.    NO ROS; Medicare Wellness Visit  Psycho social [place] and [place] move to [place] on off season Has GF Grows roses; has 40 at home Will be gardening in the [employer] as good, fair or great? good  Preventive Screening -Counseling & Management  Last CPE; 09/27/2015 PSA followed by Dr Diona Fanti - next scheduled in 2 years for bx But checks annually    Current smoking/ tobacco status/ never smoked  Second Hand Smoke status; No Smokers in the home  ETOH Daily/ 3 per day; beers  Recommendation is for 2 per day  RISK FACTORS Regular exercise / works his gardens [employer] to bike; rides on the road;  Rides away from people; 40 mile  Has learned to take hydration with him  Works all the time in the yard Is strong and muscular but states this is due to work  Diet:  Focus on fish; seafood  2 to 3 times per week Infrequent red meat; chicken; no pork Salads;  Yogurt and fruit  Balanced with fruits and some vegetables   Fall risk ; no falls Mobility of Functional changes this year? No Noticed the pain in neck gets worse every year with hx of fusion at 70yo Issues with working overhead    Cardiac Risk Factors: No history Advanced aged > 68 in men; Hyperlipidemia; LDL 129  Diabetes negative  Family History  Mother had HTN; Father had DM  Mother is 16 years; father expired 64    Eye exam/ a few months ago Cataract surgery; now does not wear contacts  Dr Bing Plume   Depression Screen PhQ 2: negative  Activities of Daily Living - See functional screen   Cognitive testing; Ad8 score; 0 or less than 2  MMSE deferred or completed if AD8 + 2  issues  Colonoscopy: 11/2008/ due 2020   Advanced Directives / completed and not in the chart  Will bring it in and let Dr. Elease Hashimoto   List the name of Physicians or other Practitioners you currently use:  Dr Diona Fanti following PSA   Immunization History  Administered Date(s) Administered  . Influenza-Unspecified 08/23/2014, 07/09/2015  . Pneumococcal Conjugate-13 09/27/2015  . Pneumococcal Polysaccharide-23 04/10/2013  . Tdap 04/11/2007  . Zoster 04/10/2013   Required Immunizations needed today  Screening test up to date or reviewed for plan of completion Health Maintenance Due  Topic Date Due  . Hepatitis C Screening  09/17/1945  . INFLUENZA VACCINE  04/07/2016   Cardiac Risk Factors include: advanced age (>53men, >55 women);male genderGave blood in 2012; and gave 20 units overtime Did have a GF with non AB hep  Will agee with hep c   Will take flu vaccine    Objective:    Today's Vitals   08/04/16 0808  BP: 140/90  Pulse: 70  SpO2: 98%  Weight: 191 lb 8 oz (86.9 kg)  Height: 6' (1.829 m)   Body mass index is 25.97 kg/m.  Current Medications (verified) Outpatient Encounter Prescriptions as of 08/04/2016  Medication Sig  . ibuprofen (ADVIL,MOTRIN) 200 MG tablet Take 200 mg by mouth every 8 (eight) hours as needed (just as needed).   No facility-administered  encounter medications on file as of 08/04/2016.     Allergies (verified) Patient has no known allergies.   History: Past Medical History:  Diagnosis Date  . 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 Arthritis   . Chicken pox   . DDD (degenerative disc disease)   . DJD (degenerative joint disease)   . No pertinent past medical history   . Rupture of subscapularis tendon, irreparable 07/01/2012  . Shoulder impingement syndrome, left 07/01/2012   Past Surgical History:  Procedure Laterality Date  . [place]   right  . cataracts Bilateral   . CERVICAL FUSION  1991  .  COLONOSCOPY     x2  . KNEE ARTHROPLASTY  1976   left open repair  . KNEE ARTHROSCOPY  [id number]   right kneex3   Family History  Problem Relation Age of Onset  . Hypertension Mother   . Diabetes Father    Social History   Occupational History  . Not on file.   Social History Main Topics  . Smoking status: Never Smoker  . Smokeless tobacco: Never Used  . Alcohol use Yes     Comment: daily  . Drug use: No  . Sexual activity: Not on file   Tobacco Counseling Counseling given: Yes   Activities of Daily Living In your present state of health, do you have any difficulty performing the following activities: 08/04/2016  Hearing? N  Vision? N  Difficulty concentrating or making decisions? N  Walking or climbing stairs? N  Dressing or bathing? N  Doing errands, shopping? N  Preparing Food and eating ? N  Using the Toilet? N  In the past six months, have you accidently leaked urine? N  Do you have problems with loss of bowel control? N  Managing your Medications? N  Managing your Finances? N  Housekeeping or managing your Housekeeping? N  Some recent data might be hidden    Immunizations and Health Maintenance Immunization History  Administered Date(s) Administered  . Influenza-Unspecified 08/23/2014, 07/09/2015  . Pneumococcal Conjugate-13 09/27/2015  . Pneumococcal Polysaccharide-23 04/10/2013  . Tdap 04/11/2007  . Zoster 04/10/2013   Health Maintenance Due  Topic Date Due  . Hepatitis C Screening  09-20-1945  . INFLUENZA VACCINE  04/07/2016    Patient Care Team: [name], MD as PCP - General (Family Medicine)  Indicate any recent Medical Services you may have received from other than [facility] providers in the past year (date may be approximate).    Assessment:   This is a routine wellness examination for [name].   Very active; enjoys building and renovating; has rose garden No issues;  Mild pain in neck which gets worse if he uses his upper body  but is relieved by occasional ibuprofen  Hearing/Vision screen  Hearing Screening   125Hz  250Hz  500Hz  1000Hz  2000Hz  3000Hz  4000Hz  6000Hz  8000Hz   Right ear:       100    Left ear:       100      Dietary issues and exercise activities discussed: Current Exercise Habits: Home exercise routine, Time (Minutes): > 60, Frequency (Times/Week): 5, Weekly Exercise (Minutes/Week): 0, Intensity: Moderate  Goals    . patient          Goal is to relax, set up his telescope at home      Depression Screen PHQ 2/9 Scores 08/04/2016 03/28/2015  PHQ - 2 Score 0 0    Fall Risk Fall Risk  08/04/2016 03/28/2015  Falls  in the past year? No No    Cognitive Function: MMSE - Mini Mental State Exam 08/04/2016  Not completed: (No Data)      no issues; alert and oriented ; Was a Optician, dispensing Tests Health Maintenance  Topic Date Due  . Hepatitis C Screening  25-Jul-1946  . INFLUENZA VACCINE  04/07/2016  . TETANUS/TDAP  04/10/2017  . COLONOSCOPY  11/07/2018  . ZOSTAVAX  Completed  . PNA vac Low Risk Adult  Completed        Plan:   Had flu vaccine today  Reviewed chol levels; BP 140 90 on admission but was am BP No hx of HTN;   Dr Diona Fanti may bx in 2 years   No new issues  During the course of the visit Jeanphilippe was educated and counseled about the following appropriate screening and preventive services:   Vaccines to include Pneumoccal, Influenza, Hepatitis B, Td, Zostavax, HCV  Electrocardiogram  Colorectal cancer screening  Cardiovascular disease screening  Diabetes screening  Glaucoma screening neg ; dr. Bing Plume   Nutrition counseling  Prostate cancer screening by [facility] may bx in 2 years   Smoking cessation counseling  Patient Instructions (the written plan) were given to the patient.   Wynetta Fines, RN   08/04/2016

## 2016-08-04 NOTE — Patient Instructions (Addendum)
Alan Hodges , Thank you for taking time to come for your Medicare Wellness Visit. I appreciate your ongoing commitment to your health goals. Please review the following plan we discussed and let me know if I can assist you in the future.   Will take high does flu shot today  Will order Hep c and can have completed at the next blood draw    These are the goals we discussed: Goals    . patient          Goal is to relax, set up his telescope at home       This is a list of the screening recommended for you and due dates:  Health Maintenance  Topic Date Due  .  Hepatitis C: One time screening is recommended by Center for Disease Control  (CDC) for  adults born from [birth year] through 1963.   Jan 06, 1946  . Flu Shot  04/07/2016  . Tetanus Vaccine  04/10/2017  . Colon Cancer Screening  11/07/2018  . Shingles Vaccine  Completed  . Pneumonia vaccines  Completed      Fall Prevention in the Home Introduction Falls can cause injuries. They can happen to people of all ages. There are many things you can do to make your home safe and to help prevent falls. What can I do on the outside of my home?  Regularly fix the edges of walkways and driveways and fix any cracks.  Remove anything that might make you trip as you walk through a door, such as a raised step or threshold.  Trim any bushes or trees on the path to your home.  Use bright outdoor lighting.  Clear any walking paths of anything that might make someone trip, such as rocks or tools.  Regularly check to see if handrails are loose or broken. Make sure that both sides of any steps have handrails.  Any raised decks and porches should have guardrails on the edges.  Have any leaves, snow, or ice cleared regularly.  Use sand or salt on walking paths during winter.  Clean up any spills in your garage right away. This includes oil or grease spills. What can I do in the bathroom?  Use night lights.  Install grab bars by the toilet  and in the tub and shower. Do not use towel bars as grab bars.  Use non-skid mats or decals in the tub or shower.  If you need to sit down in the shower, use a plastic, non-slip stool.  Keep the floor dry. Clean up any water that spills on the floor as soon as it happens.  Remove soap buildup in the tub or shower regularly.  Attach bath mats securely with double-sided non-slip rug tape.  Do not have throw rugs and other things on the floor that can make you trip. What can I do in the bedroom?  Use night lights.  Make sure that you have a light by your bed that is easy to reach.  Do not use any sheets or blankets that are too big for your bed. They should not hang down onto the floor.  Have a firm chair that has side arms. You can use this for support while you get dressed.  Do not have throw rugs and other things on the floor that can make you trip. What can I do in the kitchen?  Clean up any spills right away.  Avoid walking on wet floors.  Keep items that you use a lot  in easy-to-reach places.  If you need to reach something above you, use a strong step stool that has a grab bar.  Keep electrical cords out of the way.  Do not use floor polish or wax that makes floors slippery. If you must use wax, use non-skid floor wax.  Do not have throw rugs and other things on the floor that can make you trip. What can I do with my stairs?  Do not leave any items on the stairs.  Make sure that there are handrails on both sides of the stairs and use them. Fix handrails that are broken or loose. Make sure that handrails are as long as the stairways.  Check any carpeting to make sure that it is firmly attached to the stairs. Fix any carpet that is loose or worn.  Avoid having throw rugs at the top or bottom of the stairs. If you do have throw rugs, attach them to the floor with carpet tape.  Make sure that you have a light switch at the top of the stairs and the bottom of the  stairs. If you do not have them, ask someone to add them for you. What else can I do to help prevent falls?  Wear shoes that:  Do not have high heels.  Have rubber bottoms.  Are comfortable and fit you well.  Are closed at the toe. Do not wear sandals.  If you use a stepladder:  Make sure that it is fully opened. Do not climb a closed stepladder.  Make sure that both sides of the stepladder are locked into place.  Ask someone to hold it for you, if possible.  Clearly mark and make sure that you can see:  Any grab bars or handrails.  First and last steps.  Where the edge of each step is.  Use tools that help you move around (mobility aids) if they are needed. These include:  Canes.  Walkers.  Scooters.  Crutches.  Turn on the lights when you go into a dark area. Replace any light bulbs as soon as they burn out.  Set up your furniture so you have a clear path. Avoid moving your furniture around.  If any of your floors are uneven, fix them.  If there are any pets around you, be aware of where they are.  Review your medicines with your doctor. Some medicines can make you feel dizzy. This can increase your chance of falling. Ask your doctor what other things that you can do to help prevent falls. This information is not intended to replace advice given to you by your health care provider. Make sure you discuss any questions you have with your health care provider. Document Released: 06/20/2009 Document Revised: 01/30/2016 Document Reviewed: 09/28/2014  2017 Elsevier  Health Maintenance, Male A healthy lifestyle and preventative care can promote health and wellness.  Maintain regular health, dental, and eye exams.  Eat a healthy diet. Foods like vegetables, fruits, whole grains, low-fat dairy products, and lean protein foods contain the nutrients you need and are low in calories. Decrease your intake of foods high in solid fats, added sugars, and salt. Get  information about a proper diet from your health care provider, if necessary.  Regular physical exercise is one of the most important things you can do for your health. Most adults should get at least 150 minutes of moderate-intensity exercise (any activity that increases your heart rate and causes you to sweat) each week. In addition, most adults  need muscle-strengthening exercises on 2 or more days a week.   Maintain a healthy weight. The body mass index (BMI) is a screening tool to identify possible weight problems. It provides an estimate of body fat based on height and weight. Your health care provider can find your BMI and can help you achieve or maintain a healthy weight. For males 20 years and older:  A BMI below 18.5 is considered underweight.  A BMI of 18.5 to 24.9 is normal.  A BMI of 25 to 29.9 is considered overweight.  A BMI of 30 and above is considered obese.  Maintain normal blood lipids and cholesterol by exercising and minimizing your intake of saturated fat. Eat a balanced diet with plenty of fruits and vegetables. Blood tests for lipids and cholesterol should begin at age 31 and be repeated every 5 years. If your lipid or cholesterol levels are high, you are over age 45, or you are at high risk for heart disease, you may need your cholesterol levels checked more frequently.Ongoing high lipid and cholesterol levels should be treated with medicines if diet and exercise are not working.  If you smoke, find out from your health care provider how to quit. If you do not use tobacco, do not start.  Lung cancer screening is recommended for adults aged 1-80 years who are at high risk for developing lung cancer because of a history of smoking. A yearly low-dose CT scan of the lungs is recommended for people who have at least a 30-pack-year history of smoking and are current smokers or have quit within the past 15 years. A pack year of smoking is smoking an average of 1 pack of  cigarettes a day for 1 year (for example, a 30-pack-year history of smoking could mean smoking 1 pack a day for 30 years or 2 packs a day for 15 years). Yearly screening should continue until the smoker has stopped smoking for at least 15 years. Yearly screening should be stopped for people who develop a health problem that would prevent them from having lung cancer treatment.  If you choose to drink alcohol, do not have more than 2 drinks per day. One drink is considered to be 12 oz (360 mL) of beer, 5 oz (150 mL) of wine, or 1.5 oz (45 mL) of liquor.  Avoid the use of street drugs. Do not share needles with anyone. Ask for help if you need support or instructions about stopping the use of drugs.  High blood pressure causes heart disease and increases the risk of stroke. High blood pressure is more likely to develop in:  People who have blood pressure in the end of the normal range (100-139/85-89 mm Hg).  People who are overweight or obese.  People who are African American.  If you are 53-37 years of age, have your blood pressure checked every 3-5 years. If you are 63 years of age or older, have your blood pressure checked every year. You should have your blood pressure measured twice-once when you are at a hospital or clinic, and once when you are not at a hospital or clinic. Record the average of the two measurements. To check your blood pressure when you are not at a hospital or clinic, you can use:  An automated blood pressure machine at a pharmacy.  A home blood pressure monitor.  If you are 45-47 years old, ask your health care provider if you should take aspirin to prevent heart disease.  Diabetes screening involves  taking a blood sample to check your fasting blood sugar level. This should be done once every 3 years after age 74 if you are at a normal weight and without risk factors for diabetes. Testing should be considered at a younger age or be carried out more frequently if you are  overweight and have at least 1 risk factor for diabetes.  Colorectal cancer can be detected and often prevented. Most routine colorectal cancer screening begins at the age of 46 and continues through age 45. However, your health care provider may recommend screening at an earlier age if you have risk factors for colon cancer. On a yearly basis, your health care provider may provide home test kits to check for hidden blood in the stool. A small camera at the end of a tube may be used to directly examine the colon (sigmoidoscopy or colonoscopy) to detect the earliest forms of colorectal cancer. Talk to your health care provider about this at age 24 when routine screening begins. A direct exam of the colon should be repeated every 5-10 years through age 44, unless early forms of precancerous polyps or small growths are found.  People who are at an increased risk for hepatitis B should be screened for this virus. You are considered at high risk for hepatitis B if:  You were born in a country where hepatitis B occurs often. Talk with your health care provider about which countries are considered high risk.  Your parents were born in a high-risk country and you have not received a shot to protect against hepatitis B (hepatitis B vaccine).  You have HIV or AIDS.  You use needles to inject street drugs.  You live with, or have sex with, someone who has hepatitis B.  You are a man who has sex with other men (MSM).  You get hemodialysis treatment.  You take certain medicines for conditions like cancer, organ transplantation, and autoimmune conditions.  Hepatitis C blood testing is recommended for all people born from [birth year] through 1965 and any individual with known risk factors for hepatitis C.  Healthy men should no longer receive prostate-specific antigen (PSA) blood tests as part of routine cancer screening. Talk to your health care provider about prostate cancer screening.  Testicular cancer  screening is not recommended for adolescents or adult males who have no symptoms. Screening includes self-exam, a health care provider exam, and other screening tests. Consult with your health care provider about any symptoms you have or any concerns you have about testicular cancer.  Practice safe sex. Use condoms and avoid high-risk sexual practices to reduce the spread of sexually transmitted infections (STIs).  You should be screened for STIs, including gonorrhea and chlamydia if:  You are sexually active and are younger than 24 years.  You are older than 24 years, and your health care provider tells you that you are at risk for this type of infection.  Your sexual activity has changed since you were last screened, and you are at an increased risk for chlamydia or gonorrhea. Ask your health care provider if you are at risk.  If you are at risk of being infected with HIV, it is recommended that you take a prescription medicine daily to prevent HIV infection. This is called pre-exposure prophylaxis (PrEP). You are considered at risk if:  You are a man who has sex with other men (MSM).  You are a heterosexual man who is sexually active with multiple partners.  You take drugs by injection.  You are sexually active with a partner who has HIV.  Talk with your health care provider about whether you are at high risk of being infected with HIV. If you choose to begin PrEP, you should first be tested for HIV. You should then be tested every 3 months for as long as you are taking PrEP.  Use sunscreen. Apply sunscreen liberally and repeatedly throughout the day. You should seek shade when your shadow is shorter than you. Protect yourself by wearing long sleeves, pants, a wide-brimmed hat, and sunglasses year round whenever you are outdoors.  Tell your health care provider of new moles or changes in moles, especially if there is a change in shape or color. Also, tell your health care provider if a  mole is larger than the size of a pencil eraser.  A one-time screening for abdominal aortic aneurysm (AAA) and surgical repair of large AAAs by ultrasound is recommended for men aged 42-75 years who are current or former smokers.  Stay current with your vaccines (immunizations). This information is not intended to replace advice given to you by your health care provider. Make sure you discuss any questions you have with your health care provider. Document Released: 02/20/2008 Document Revised: 09/14/2014 Document Reviewed: 05/28/2015 Elsevier Interactive Patient Education  2017 [employer].

## 2016-10-15 DIAGNOSIS — M1712 Unilateral primary osteoarthritis, left knee: Secondary | ICD-10-CM | POA: Diagnosis not present

## 2016-11-03 DIAGNOSIS — M79641 Pain in right hand: Secondary | ICD-10-CM | POA: Diagnosis not present

## 2016-11-03 DIAGNOSIS — S0180XA Unspecified open wound of other part of head, initial encounter: Secondary | ICD-10-CM | POA: Diagnosis not present

## 2016-11-03 DIAGNOSIS — S61409A Unspecified open wound of unspecified hand, initial encounter: Secondary | ICD-10-CM | POA: Diagnosis not present

## 2016-11-03 DIAGNOSIS — R51 Headache: Secondary | ICD-10-CM | POA: Diagnosis not present

## 2016-11-10 DIAGNOSIS — S61409D Unspecified open wound of unspecified hand, subsequent encounter: Secondary | ICD-10-CM | POA: Diagnosis not present

## 2016-11-10 DIAGNOSIS — R51 Headache: Secondary | ICD-10-CM | POA: Diagnosis not present

## 2016-11-10 DIAGNOSIS — S0180XD Unspecified open wound of other part of head, subsequent encounter: Secondary | ICD-10-CM | POA: Diagnosis not present

## 2016-11-10 DIAGNOSIS — M79641 Pain in right hand: Secondary | ICD-10-CM | POA: Diagnosis not present

## 2016-12-23 ENCOUNTER — Ambulatory Visit (INDEPENDENT_AMBULATORY_CARE_PROVIDER_SITE_OTHER): Payer: Medicare Other | Admitting: Family Medicine

## 2016-12-23 ENCOUNTER — Encounter: Payer: Self-pay | Admitting: Family Medicine

## 2016-12-23 VITALS — BP 130/90 | HR 61 | Temp 98.1°F | Ht 72.0 in | Wt 189.0 lb

## 2016-12-23 DIAGNOSIS — Z0000 Encounter for general adult medical examination without abnormal findings: Secondary | ICD-10-CM | POA: Diagnosis not present

## 2016-12-23 DIAGNOSIS — C61 Malignant neoplasm of prostate: Secondary | ICD-10-CM

## 2016-12-23 LAB — CBC WITH DIFFERENTIAL/PLATELET
BASOS ABS: 0 10*3/uL (ref 0.0–0.1)
BASOS PCT: 0.4 % (ref 0.0–3.0)
Eosinophils Absolute: 0.2 10*3/uL (ref 0.0–0.7)
Eosinophils Relative: 2.7 % (ref 0.0–5.0)
HEMATOCRIT: 45.3 % (ref 39.0–52.0)
HEMOGLOBIN: 15.6 g/dL (ref 13.0–17.0)
LYMPHS PCT: 33.9 % (ref 12.0–46.0)
Lymphs Abs: 2.1 10*3/uL (ref 0.7–4.0)
MCHC: 34.4 g/dL (ref 30.0–36.0)
MCV: 93.6 fl (ref 78.0–100.0)
MONOS PCT: 8.3 % (ref 3.0–12.0)
Monocytes Absolute: 0.5 10*3/uL (ref 0.1–1.0)
NEUTROS ABS: 3.3 10*3/uL (ref 1.4–7.7)
Neutrophils Relative %: 54.7 % (ref 43.0–77.0)
PLATELETS: 260 10*3/uL (ref 150.0–400.0)
RBC: 4.84 Mil/uL (ref 4.22–5.81)
RDW: 12.7 % (ref 11.5–15.5)
WBC: 6.1 10*3/uL (ref 4.0–10.5)

## 2016-12-23 LAB — PSA
PSA: 5.32
PSA: 5.32 ng/mL — ABNORMAL HIGH (ref 0.10–4.00)

## 2016-12-23 LAB — HEPATIC FUNCTION PANEL
ALBUMIN: 4.1 g/dL (ref 3.5–5.2)
ALT: 15 U/L (ref 0–53)
AST: 17 U/L (ref 0–37)
Alkaline Phosphatase: 64 U/L (ref 39–117)
Bilirubin, Direct: 0.1 mg/dL (ref 0.0–0.3)
Total Bilirubin: 0.7 mg/dL (ref 0.2–1.2)
Total Protein: 6.6 g/dL (ref 6.0–8.3)

## 2016-12-23 LAB — BASIC METABOLIC PANEL
BUN: 27 mg/dL — ABNORMAL HIGH (ref 6–23)
CALCIUM: 9 mg/dL (ref 8.4–10.5)
CO2: 28 mEq/L (ref 19–32)
CREATININE: 1.03 mg/dL (ref 0.40–1.50)
Chloride: 104 mEq/L (ref 96–112)
GFR: 76.16 mL/min (ref >60.00)
Glucose, Bld: 105 mg/dL — ABNORMAL HIGH (ref 70–99)
Potassium: 4.6 mEq/L (ref 3.5–5.1)
Sodium: 138 mEq/L (ref 135–145)

## 2016-12-23 LAB — TSH: TSH: 1.66 u[IU]/mL (ref 0.35–4.50)

## 2016-12-23 LAB — LIPID PANEL
CHOL/HDL RATIO: 3
CHOLESTEROL: 188 mg/dL (ref 0–200)
HDL: 59.2 mg/dL (ref >39.00)
LDL Cholesterol: 111 mg/dL — ABNORMAL HIGH (ref 0–99)
NonHDL: 128.56
TRIGLYCERIDES: 89 mg/dL (ref 0.0–149.0)
VLDL: 17.8 mg/dL (ref 0.0–40.0)

## 2016-12-23 NOTE — Progress Notes (Signed)
Subjective:     Patient ID: Alan Hodges, male   DOB: [birth year], 71 y.o.   MRN: [id number]  HPI Patient seen for physical exam area he has history of prostate cancer is followed by urology regularly. He has osteoarthritis mostly involving the left knee and he is looking at possible left total knee replacement sometime around November. He states he has a Gleason score of 6 and is being observed at this point without any active treatment for his prostate cancer. He takes no regular prescription medications.  His exercise has been somewhat limited because of his knee difficulties. He has not had previous hepatitis C screening. Will need tetanus booster next year. Other immunizations up-to-date. Patient is nonsmoker.  He is retired. He plans to move to just outside of [place] within the next month.  Past Medical History:  Diagnosis Date  . 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 Arthritis   . Chicken pox   . DDD (degenerative disc disease)   . DJD (degenerative joint disease)   . No pertinent past medical history   . Rupture of subscapularis tendon, irreparable 07/01/2012  . Shoulder impingement syndrome, left 07/01/2012   Past Surgical History:  Procedure Laterality Date  . [place]   right  . cataracts Bilateral   . CERVICAL FUSION  1991  . COLONOSCOPY     x2  . KNEE ARTHROPLASTY  1976   left open repair  . KNEE ARTHROSCOPY  66,29,47   right kneex3    reports that he has never smoked. He has never used smokeless tobacco. He reports that he drinks alcohol. He reports that he does not use drugs. family history includes Diabetes in his father; Hypertension in his mother. No Known Allergies   Review of Systems  Constitutional: Negative for activity change, appetite change, fatigue, fever and unexpected weight change.  HENT: Negative for congestion, ear pain and trouble swallowing.   Eyes: Negative for pain and visual disturbance.  Respiratory: Negative  for cough, shortness of breath and wheezing.   Cardiovascular: Negative for chest pain and palpitations.  Gastrointestinal: Negative for abdominal distention, abdominal pain, blood in stool, constipation, diarrhea, nausea, rectal pain and vomiting.  Endocrine: Negative for polydipsia and polyuria.  Genitourinary: Negative for dysuria, hematuria and testicular pain.  Musculoskeletal: Positive for arthralgias. Negative for joint swelling.  Skin: Negative for rash.  Neurological: Negative for dizziness, syncope and headaches.  Hematological: Negative for adenopathy.  Psychiatric/Behavioral: Negative for confusion and dysphoric mood.       Objective:   Physical Exam  Constitutional: He is oriented to person, place, and time. He appears well-developed and well-nourished. No distress.  HENT:  Head: Normocephalic and atraumatic.  Right Ear: External ear normal.  Left Ear: External ear normal.  Mouth/Throat: Oropharynx is clear and moist.  Eyes: Conjunctivae and EOM are normal. Pupils are equal, round, and reactive to light.  Neck: Normal range of motion. Neck supple. No thyromegaly present.  Cardiovascular: Normal rate, regular rhythm and normal heart sounds.   No murmur heard. Pulmonary/Chest: No respiratory distress. He has no wheezes. He has no rales.  Abdominal: Soft. Bowel sounds are normal. He exhibits no distension and no mass. There is no tenderness. There is no rebound and no guarding.  Genitourinary:  Genitourinary Comments: Deferred per urology.  Musculoskeletal: He exhibits no edema.  Lymphadenopathy:    He has no cervical adenopathy.  Neurological: He is alert and oriented to person, place, and time. He displays normal reflexes. No cranial  nerve deficit.  Skin: No rash noted.  Psychiatric: He has a normal mood and affect.       Assessment:     Physical exam. Patient will need tetanus booster within the next year. He has borderline elevated blood pressure today    Plan:      -Monitor blood pressure closely and be in touch if consistently greater than 140/90 -Obtain screening lab work -[employer] include PSA per patient request -Continue with yearly flu vaccination  [name] MD [facility] at [facility]

## 2016-12-23 NOTE — Patient Instructions (Signed)
Monitor blood pressure and be in touch if consistently > 140/90.   

## 2016-12-23 NOTE — Progress Notes (Signed)
Pre visit review using our clinic review tool, if applicable. No additional management support is needed unless otherwise documented below in the visit note. 

## 2016-12-24 ENCOUNTER — Encounter: Payer: Self-pay | Admitting: Family Medicine

## 2016-12-24 LAB — HEPATITIS C ANTIBODY: HCV Ab: NEGATIVE

## 2017-02-05 DIAGNOSIS — C61 Malignant neoplasm of prostate: Secondary | ICD-10-CM | POA: Diagnosis not present

## 2017-02-05 DIAGNOSIS — N400 Enlarged prostate without lower urinary tract symptoms: Secondary | ICD-10-CM | POA: Diagnosis not present

## 2017-02-10 ENCOUNTER — Encounter: Payer: Self-pay | Admitting: Family Medicine

## 2017-02-13 DIAGNOSIS — S81831A Puncture wound without foreign body, right lower leg, initial encounter: Secondary | ICD-10-CM | POA: Diagnosis not present

## 2017-02-13 DIAGNOSIS — L0390 Cellulitis, unspecified: Secondary | ICD-10-CM | POA: Diagnosis not present

## 2017-05-27 ENCOUNTER — Encounter: Payer: Self-pay | Admitting: Family Medicine

## 2017-05-31 DIAGNOSIS — H524 Presbyopia: Secondary | ICD-10-CM | POA: Diagnosis not present

## 2017-06-03 DIAGNOSIS — M1712 Unilateral primary osteoarthritis, left knee: Secondary | ICD-10-CM | POA: Diagnosis not present

## 2017-06-03 DIAGNOSIS — M25461 Effusion, right knee: Secondary | ICD-10-CM | POA: Diagnosis not present

## 2017-07-06 DIAGNOSIS — Z23 Encounter for immunization: Secondary | ICD-10-CM | POA: Diagnosis not present

## 2017-08-06 DIAGNOSIS — C61 Malignant neoplasm of prostate: Secondary | ICD-10-CM | POA: Diagnosis not present

## 2017-08-13 DIAGNOSIS — C61 Malignant neoplasm of prostate: Secondary | ICD-10-CM | POA: Diagnosis not present

## 2018-02-14 ENCOUNTER — Encounter: Payer: Self-pay | Admitting: Family Medicine

## 2018-02-14 ENCOUNTER — Ambulatory Visit (INDEPENDENT_AMBULATORY_CARE_PROVIDER_SITE_OTHER): Payer: Medicare Other | Admitting: Family Medicine

## 2018-02-14 VITALS — BP 150/90 | HR 74 | Temp 97.9°F | Ht 71.5 in | Wt 183.6 lb

## 2018-02-14 DIAGNOSIS — Z125 Encounter for screening for malignant neoplasm of prostate: Secondary | ICD-10-CM

## 2018-02-14 DIAGNOSIS — Z0000 Encounter for general adult medical examination without abnormal findings: Secondary | ICD-10-CM | POA: Diagnosis not present

## 2018-02-14 LAB — CBC WITH DIFFERENTIAL/PLATELET
Basophils Absolute: 0 10*3/uL (ref 0.0–0.1)
Basophils Relative: 0.4 % (ref 0.0–3.0)
Eosinophils Absolute: 0.1 10*3/uL (ref 0.0–0.7)
Eosinophils Relative: 1.4 % (ref 0.0–5.0)
HEMATOCRIT: 44.1 % (ref 39.0–52.0)
Hemoglobin: 15.4 g/dL (ref 13.0–17.0)
LYMPHS PCT: 32.3 % (ref 12.0–46.0)
Lymphs Abs: 1.8 10*3/uL (ref 0.7–4.0)
MCHC: 34.8 g/dL (ref 30.0–36.0)
MCV: 92.1 fl (ref 78.0–100.0)
MONOS PCT: 6.8 % (ref 3.0–12.0)
Monocytes Absolute: 0.4 10*3/uL (ref 0.1–1.0)
Neutro Abs: 3.2 10*3/uL (ref 1.4–7.7)
Neutrophils Relative %: 59.1 % (ref 43.0–77.0)
PLATELETS: 254 10*3/uL (ref 150.0–400.0)
RBC: 4.79 Mil/uL (ref 4.22–5.81)
RDW: 12.9 % (ref 11.5–15.5)
WBC: 5.5 10*3/uL (ref 4.0–10.5)

## 2018-02-14 LAB — BASIC METABOLIC PANEL
BUN: 18 mg/dL (ref 6–23)
CALCIUM: 9.2 mg/dL (ref 8.4–10.5)
CO2: 26 mEq/L (ref 19–32)
CREATININE: 0.88 mg/dL (ref 0.40–1.50)
Chloride: 104 mEq/L (ref 96–112)
GFR: 91.02 mL/min (ref >60.00)
Glucose, Bld: 99 mg/dL (ref 70–99)
Potassium: 4 mEq/L (ref 3.5–5.1)
Sodium: 139 mEq/L (ref 135–145)

## 2018-02-14 LAB — LIPID PANEL
CHOL/HDL RATIO: 4
CHOLESTEROL: 185 mg/dL (ref 0–200)
HDL: 48.1 mg/dL (ref >39.00)
LDL Cholesterol: 112 mg/dL — ABNORMAL HIGH (ref 0–99)
NonHDL: 136.89
Triglycerides: 122 mg/dL (ref 0.0–149.0)
VLDL: 24.4 mg/dL (ref 0.0–40.0)

## 2018-02-14 LAB — HEPATIC FUNCTION PANEL
ALK PHOS: 75 U/L (ref 39–117)
ALT: 18 U/L (ref 0–53)
AST: 17 U/L (ref 0–37)
Albumin: 4.2 g/dL (ref 3.5–5.2)
BILIRUBIN DIRECT: 0.1 mg/dL (ref 0.0–0.3)
TOTAL PROTEIN: 6.8 g/dL (ref 6.0–8.3)
Total Bilirubin: 0.7 mg/dL (ref 0.2–1.2)

## 2018-02-14 LAB — PSA: PSA: 6.23 ng/mL — ABNORMAL HIGH (ref 0.10–4.00)

## 2018-02-14 LAB — TSH: TSH: 1.62 u[IU]/mL (ref 0.35–4.50)

## 2018-02-14 MED ORDER — LISINOPRIL 10 MG PO TABS
10.0000 mg | ORAL_TABLET | Freq: Every day | ORAL | 3 refills | Status: DC
Start: 2018-02-14 — End: 2018-12-12

## 2018-02-14 NOTE — Patient Instructions (Signed)
Monitor blood pressure and be in touch if consistently > 150/90.    We will call you with labs done today.

## 2018-02-14 NOTE — Progress Notes (Signed)
  Subjective:     Patient ID: Alan Hodges, male   DOB: [birth year], 72 y.o.   MRN: [id number]  HPI Patient seen for physical exam. He has severe osteoarthritis left knee and was contemplating surgery at [place] last year but decided against this. He is getting around fairly well. He was at dentist earlier today and states he had blood pressure 818E systolic around 993 diastolic. No headaches or dizziness  No chest pains. Not been cycling but has been very [place].  Past Medical History:  Diagnosis Date  . 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 Arthritis   . Chicken pox   . DDD (degenerative disc disease)   . DJD (degenerative joint disease)   . No pertinent past medical history   . Rupture of subscapularis tendon, irreparable 07/01/2012  . Shoulder impingement syndrome, left 07/01/2012   Past Surgical History:  Procedure Laterality Date  . [place]   right  . cataracts Bilateral   . CERVICAL FUSION  1991  . COLONOSCOPY     x2  . KNEE ARTHROPLASTY  1976   left open repair  . KNEE ARTHROSCOPY  71,69,67   right kneex3    reports that he has never smoked. He has never used smokeless tobacco. He reports that he drinks alcohol. He reports that he does not use drugs. family history includes Diabetes in his father; Hypertension in his mother. No Known Allergies   Review of Systems  Constitutional: Negative for fatigue.  Eyes: Negative for visual disturbance.  Respiratory: Negative for cough, chest tightness and shortness of breath.   Cardiovascular: Negative for chest pain, palpitations and leg swelling.  Neurological: Negative for dizziness, syncope, weakness, light-headedness and headaches.       Objective:   Physical Exam  Constitutional: He is oriented to person, place, and time. He appears well-developed and well-nourished. No distress.  HENT:  Head: Normocephalic and atraumatic.   Right Ear: External ear normal.  Left Ear: External ear normal.  Mouth/Throat: Oropharynx is clear and moist.  Eyes: Pupils are equal, round, and reactive to light. Conjunctivae and EOM are normal.  Neck: Normal range of motion. Neck supple. No thyromegaly present.  Cardiovascular: Normal rate, regular rhythm and normal heart sounds.  No murmur heard. Pulmonary/Chest: No respiratory distress. He has no wheezes. He has no rales.  Abdominal: Soft. Bowel sounds are normal. He exhibits no distension and no mass. There is no tenderness. There is no rebound and no guarding.  Musculoskeletal: He exhibits no edema.  Lymphadenopathy:    He has no cervical adenopathy.  Neurological: He is alert and oriented to person, place, and time. He displays normal reflexes. No cranial nerve deficit.  Skin: No rash noted.  Psychiatric: He has a normal mood and affect.       Assessment:     Physical exam. He has elevated blood pressure with multiple recent readings > 140/90.  The following issues were addressed    Plan:     -immunizations up-to-date  -Obtain screening labs  -Start lisinopril 10 mg daily  -Patient will monitor blood pressure closely and be in touch if not seeing readings consistently less than 150/90  -Follow-up within 4 months to reassess   [name] MD [facility] at [facility]

## 2018-03-04 ENCOUNTER — Encounter: Payer: Self-pay | Admitting: Family Medicine

## 2018-03-30 DIAGNOSIS — C61 Malignant neoplasm of prostate: Secondary | ICD-10-CM | POA: Diagnosis not present

## 2018-03-30 DIAGNOSIS — N400 Enlarged prostate without lower urinary tract symptoms: Secondary | ICD-10-CM | POA: Diagnosis not present

## 2018-05-23 ENCOUNTER — Encounter: Payer: Self-pay | Admitting: Family Medicine

## 2018-06-03 DIAGNOSIS — H524 Presbyopia: Secondary | ICD-10-CM | POA: Diagnosis not present

## 2018-06-22 DIAGNOSIS — Z23 Encounter for immunization: Secondary | ICD-10-CM | POA: Diagnosis not present

## 2018-09-30 DIAGNOSIS — C61 Malignant neoplasm of prostate: Secondary | ICD-10-CM | POA: Diagnosis not present

## 2018-10-06 ENCOUNTER — Ambulatory Visit: Payer: Self-pay

## 2018-10-06 NOTE — Telephone Encounter (Signed)
Pt called from his dental office to say that his procedure was canceled today because his BP was too high. They recorded in the office 185/118 HR 58, 176/11 HR 62,and 162/109 HR61. Pt denies symptoms of hypertension. No blurred vision, chest pain or headache. No weakness. Pt denies missing any doses of his BP medication (lisinopril 10 mg).Pt has been checking his BP at home and will bring hx to office. Appointment scheduled per protocol. Care advice read to patient. Pt verbalized understanding of all instructions.  Reason for Disposition . Systolic BP  >= 426 OR Diastolic >= 834  Answer Assessment - Initial Assessment Questions 1. BLOOD PRESSURE: "What is the blood pressure?" "Did you take at least two measurements 5 minutes apart?"     185/118 162/109 2. ONSET: "When did you take your blood pressure?"     today 3. HOW: "How did you obtain the blood pressure?" (e.g., visiting nurse, automatic home BP monitor)     Dental office 4. HISTORY: "Do you have a history of high blood pressure?"     HTN 5. MEDICATIONS: "Are you taking any medications for blood pressure?" "Have you missed any doses recently?"     Lisinopril 10 6. OTHER SYMPTOMS: "Do you have any symptoms?" (e.g., headache, chest pain, blurred vision, difficulty breathing, weakness)    no 7. PREGNANCY: "Is there any chance you are pregnant?" "When was your last menstrual period?"     N/A  Protocols used: HIGH BLOOD PRESSURE-A-AH

## 2018-10-07 ENCOUNTER — Encounter: Payer: Self-pay | Admitting: Family Medicine

## 2018-10-07 ENCOUNTER — Other Ambulatory Visit: Payer: Self-pay

## 2018-10-07 ENCOUNTER — Ambulatory Visit: Payer: Medicare Other | Admitting: Family Medicine

## 2018-10-07 VITALS — BP 160/92 | HR 85 | Temp 97.7°F | Ht 71.5 in | Wt 186.6 lb

## 2018-10-07 DIAGNOSIS — C61 Malignant neoplasm of prostate: Secondary | ICD-10-CM | POA: Diagnosis not present

## 2018-10-07 DIAGNOSIS — I10 Essential (primary) hypertension: Secondary | ICD-10-CM | POA: Diagnosis not present

## 2018-10-07 MED ORDER — AMLODIPINE BESYLATE 5 MG PO TABS: 5.0000 mg | ORAL_TABLET | Freq: Every day | ORAL | 3 refills | Status: DC

## 2018-10-07 MED ORDER — AMLODIPINE BESYLATE 5 MG PO TABS
5.0000 mg | ORAL_TABLET | Freq: Every day | ORAL | 0 refills | Status: DC
Start: 2018-10-07 — End: 2018-12-12

## 2018-10-07 NOTE — Telephone Encounter (Signed)
Patient has an appointment at 9:40am today.

## 2018-10-07 NOTE — Progress Notes (Signed)
  Subjective:     Patient ID: Alan Hodges, male   DOB: 10/06/1945, 73 y.o.   MRN: [id number]  HPI Seen for follow-up hypertension.  We added lisinopril last summer currently taking 10 mg daily.  He recently was at the dentist had blood pressure reading 185/118.  After rest this came down to 176/110.  He has not had any headaches.  No chest pains.  No peripheral edema.  Rare nonsteroidal use.  No regular alcohol use.  Stays very active.  He does not watch sodium intake and does use salt fairly liberally at times.  Past Medical History:  Diagnosis Date  . 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 Arthritis   . Chicken pox   . DDD (degenerative disc disease)   . DJD (degenerative joint disease)   . No pertinent past medical history   . Rupture of subscapularis tendon, irreparable 07/01/2012  . Shoulder impingement syndrome, left 07/01/2012   Past Surgical History:  Procedure Laterality Date  . Myton   right  . cataracts Bilateral   . CERVICAL FUSION  1991  . COLONOSCOPY     x2  . KNEE ARTHROPLASTY  1976   left open repair  . KNEE ARTHROSCOPY  66,59,93   right kneex3    reports that he has never smoked. He has never used smokeless tobacco. He reports current alcohol use. He reports that he does not use drugs. family history includes Diabetes in his father; Hypertension in his mother. No Known Allergies   Review of Systems  Constitutional: Negative for fatigue and unexpected weight change.  Eyes: Negative for visual disturbance.  Respiratory: Negative for cough, chest tightness and shortness of breath.   Cardiovascular: Negative for chest pain, palpitations and leg swelling.  Endocrine: Negative for polydipsia and polyuria.  Neurological: Negative for dizziness, syncope, weakness, light-headedness and headaches.       Objective:   Physical Exam Constitutional:      Appearance: He is well-developed.  Neck:     Musculoskeletal: Neck supple.   Thyroid: No thyromegaly.  Cardiovascular:     Rate and Rhythm: Normal rate and regular rhythm.  Pulmonary:     Effort: Pulmonary effort is normal. No respiratory distress.     Breath sounds: Normal breath sounds. No wheezing or rales.  Neurological:     Mental Status: He is alert and oriented to person, place, and time.        Assessment:     Hypertension.  Poorly controlled    Plan:     -Add amlodipine 5 mg daily and he will continue lisinopril 10 mg daily -Watch sodium intake -[place]  He will give me some readings over the next few weeks and we can titrate some of this by phone.  He will have scheduled follow-up in a few months and reassess at that point  [name] MD [facility] at [facility]

## 2018-10-07 NOTE — Patient Instructions (Signed)

## 2018-10-22 ENCOUNTER — Encounter: Payer: Self-pay | Admitting: Family Medicine

## 2018-12-12 ENCOUNTER — Encounter: Payer: Self-pay | Admitting: Family Medicine

## 2018-12-12 MED ORDER — LISINOPRIL 10 MG PO TABS: 10.0000 mg | ORAL_TABLET | Freq: Every day | ORAL | 3 refills | Status: DC

## 2018-12-12 MED ORDER — AMLODIPINE BESYLATE 5 MG PO TABS: 5.0000 mg | ORAL_TABLET | Freq: Every day | ORAL | 3 refills | Status: DC

## 2018-12-12 NOTE — Telephone Encounter (Signed)
Needing refills of Amlodipine.   BP stable by log of home readings which he sent.

## 2019-01-23 ENCOUNTER — Other Ambulatory Visit: Payer: Self-pay | Admitting: Urology

## 2019-01-23 DIAGNOSIS — C61 Malignant neoplasm of prostate: Secondary | ICD-10-CM

## 2019-05-04 ENCOUNTER — Encounter: Payer: Self-pay | Admitting: Family Medicine

## 2019-05-05 ENCOUNTER — Ambulatory Visit
Admission: RE | Admit: 2019-05-05 | Discharge: 2019-05-05 | Disposition: A | Discharge status: Home / Self Care | Payer: Medicare Other | Source: Ambulatory Visit | Attending: Urology | Admitting: Urology

## 2019-05-05 ENCOUNTER — Other Ambulatory Visit: Payer: Self-pay

## 2019-05-05 DIAGNOSIS — R9721 Rising PSA following treatment for malignant neoplasm of prostate: Secondary | ICD-10-CM | POA: Diagnosis not present

## 2019-05-05 DIAGNOSIS — C61 Malignant neoplasm of prostate: Secondary | ICD-10-CM

## 2019-05-05 MED ORDER — AMLODIPINE BESYLATE 5 MG PO TABS: 5.0000 mg | ORAL_TABLET | Freq: Every day | ORAL | 3 refills | Status: DC

## 2019-05-05 MED ORDER — LISINOPRIL 10 MG PO TABS: 10.0000 mg | ORAL_TABLET | Freq: Every day | ORAL | 3 refills | Status: DC

## 2019-05-05 MED ORDER — GADOBENATE DIMEGLUMINE 529 MG/ML IV SOLN
17.0000 mL | Freq: Once | INTRAVENOUS | Status: AC | PRN
  Administered 2019-05-05: 17 mL via INTRAVENOUS

## 2019-05-26 DIAGNOSIS — Z23 Encounter for immunization: Secondary | ICD-10-CM | POA: Diagnosis not present

## 2019-06-05 DIAGNOSIS — C61 Malignant neoplasm of prostate: Secondary | ICD-10-CM | POA: Diagnosis not present

## 2019-06-06 ENCOUNTER — Other Ambulatory Visit: Payer: Self-pay | Admitting: Urology

## 2019-06-06 ENCOUNTER — Other Ambulatory Visit (HOSPITAL_COMMUNITY): Payer: Self-pay | Admitting: Urology

## 2019-06-06 DIAGNOSIS — C61 Malignant neoplasm of prostate: Secondary | ICD-10-CM

## 2019-06-19 ENCOUNTER — Encounter (HOSPITAL_COMMUNITY)
Admission: RE | Admit: 2019-06-19 | Discharge: 2019-06-19 | Disposition: A | Discharge status: Home / Self Care | Payer: Medicare Other | Source: Ambulatory Visit | Attending: Urology | Admitting: Urology

## 2019-06-19 ENCOUNTER — Other Ambulatory Visit: Payer: Self-pay

## 2019-06-19 DIAGNOSIS — C61 Malignant neoplasm of prostate: Secondary | ICD-10-CM

## 2019-06-19 DIAGNOSIS — Z8546 Personal history of malignant neoplasm of prostate: Secondary | ICD-10-CM | POA: Diagnosis not present

## 2019-06-19 MED ORDER — TECHNETIUM TC 99M MEDRONATE IV KIT
20.0000 | PACK | Freq: Once | INTRAVENOUS | Status: AC | PRN
  Administered 2019-06-19: 20 via INTRAVENOUS

## 2019-06-27 ENCOUNTER — Other Ambulatory Visit: Payer: Self-pay | Admitting: Urology

## 2019-06-27 DIAGNOSIS — C61 Malignant neoplasm of prostate: Secondary | ICD-10-CM

## 2019-07-10 ENCOUNTER — Encounter: Payer: Medicare Other | Admitting: Family Medicine

## 2019-07-10 DIAGNOSIS — H524 Presbyopia: Secondary | ICD-10-CM | POA: Diagnosis not present

## 2019-07-14 ENCOUNTER — Ambulatory Visit
Admission: RE | Admit: 2019-07-14 | Discharge: 2019-07-14 | Disposition: A | Discharge status: Home / Self Care | Payer: Medicare Other | Source: Ambulatory Visit | Attending: Urology | Admitting: Urology

## 2019-07-14 ENCOUNTER — Other Ambulatory Visit: Payer: Self-pay

## 2019-07-14 DIAGNOSIS — M47814 Spondylosis without myelopathy or radiculopathy, thoracic region: Secondary | ICD-10-CM | POA: Diagnosis not present

## 2019-07-14 DIAGNOSIS — M48061 Spinal stenosis, lumbar region without neurogenic claudication: Secondary | ICD-10-CM | POA: Diagnosis not present

## 2019-07-14 DIAGNOSIS — M5134 Other intervertebral disc degeneration, thoracic region: Secondary | ICD-10-CM | POA: Diagnosis not present

## 2019-07-14 DIAGNOSIS — M5127 Other intervertebral disc displacement, lumbosacral region: Secondary | ICD-10-CM | POA: Diagnosis not present

## 2019-07-14 DIAGNOSIS — C61 Malignant neoplasm of prostate: Secondary | ICD-10-CM

## 2019-07-14 DIAGNOSIS — M4314 Spondylolisthesis, thoracic region: Secondary | ICD-10-CM | POA: Diagnosis not present

## 2019-07-14 DIAGNOSIS — M5124 Other intervertebral disc displacement, thoracic region: Secondary | ICD-10-CM | POA: Diagnosis not present

## 2019-07-14 MED ORDER — GADOBENATE DIMEGLUMINE 529 MG/ML IV SOLN
17.0000 mL | Freq: Once | INTRAVENOUS | Status: AC | PRN
  Administered 2019-07-14: 17 mL via INTRAVENOUS

## 2019-07-19 ENCOUNTER — Encounter: Payer: Self-pay | Admitting: Family Medicine

## 2019-07-19 ENCOUNTER — Ambulatory Visit (INDEPENDENT_AMBULATORY_CARE_PROVIDER_SITE_OTHER): Payer: Medicare Other | Admitting: Family Medicine

## 2019-07-19 ENCOUNTER — Other Ambulatory Visit: Payer: Self-pay

## 2019-07-19 VITALS — BP 130/82 | HR 61 | Temp 98.2°F | Ht 71.5 in | Wt 181.3 lb

## 2019-07-19 DIAGNOSIS — Z0000 Encounter for general adult medical examination without abnormal findings: Secondary | ICD-10-CM | POA: Diagnosis not present

## 2019-07-19 LAB — CBC WITH DIFFERENTIAL/PLATELET
Basophils Absolute: 0 10*3/uL (ref 0.0–0.1)
Basophils Relative: 0.4 % (ref 0.0–3.0)
Eosinophils Absolute: 0.1 10*3/uL (ref 0.0–0.7)
Eosinophils Relative: 1.5 % (ref 0.0–5.0)
HCT: 46.1 % (ref 39.0–52.0)
Hemoglobin: 15.8 g/dL (ref 13.0–17.0)
Lymphocytes Relative: 33.3 % (ref 12.0–46.0)
Lymphs Abs: 1.9 10*3/uL (ref 0.7–4.0)
MCHC: 34.2 g/dL (ref 30.0–36.0)
MCV: 94.1 fl (ref 78.0–100.0)
Monocytes Absolute: 0.4 10*3/uL (ref 0.1–1.0)
Monocytes Relative: 6.4 % (ref 3.0–12.0)
Neutro Abs: 3.3 10*3/uL (ref 1.4–7.7)
Neutrophils Relative %: 58.4 % (ref 43.0–77.0)
Platelets: 252 10*3/uL (ref 150.0–400.0)
RBC: 4.9 Mil/uL (ref 4.22–5.81)
RDW: 12.4 % (ref 11.5–15.5)
WBC: 5.7 10*3/uL (ref 4.0–10.5)

## 2019-07-19 LAB — LIPID PANEL
Cholesterol: 199 mg/dL (ref 0–200)
HDL: 55.8 mg/dL (ref >39.00)
LDL Cholesterol: 115 mg/dL — ABNORMAL HIGH (ref 0–99)
NonHDL: 142.89
Total CHOL/HDL Ratio: 4
Triglycerides: 138 mg/dL (ref 0.0–149.0)
VLDL: 27.6 mg/dL (ref 0.0–40.0)

## 2019-07-19 LAB — BASIC METABOLIC PANEL
BUN: 20 mg/dL (ref 6–23)
CO2: 28 mEq/L (ref 19–32)
Calcium: 9.1 mg/dL (ref 8.4–10.5)
Chloride: 102 mEq/L (ref 96–112)
Creatinine, Ser: 0.93 mg/dL (ref 0.40–1.50)
GFR: 80.02 mL/min (ref >60.00)
Glucose, Bld: 96 mg/dL (ref 70–99)
Potassium: 4.1 mEq/L (ref 3.5–5.1)
Sodium: 137 mEq/L (ref 135–145)

## 2019-07-19 LAB — TSH: TSH: 1.29 u[IU]/mL (ref 0.35–4.50)

## 2019-07-19 LAB — HEPATIC FUNCTION PANEL
ALT: 19 U/L (ref 0–53)
AST: 18 U/L (ref 0–37)
Albumin: 4.4 g/dL (ref 3.5–5.2)
Alkaline Phosphatase: 77 U/L (ref 39–117)
Bilirubin, Direct: 0.2 mg/dL (ref 0.0–0.3)
Total Bilirubin: 0.9 mg/dL (ref 0.2–1.2)
Total Protein: 7 g/dL (ref 6.0–8.3)

## 2019-07-19 MED ORDER — AMLODIPINE BESYLATE 5 MG PO TABS: 5.0000 mg | ORAL_TABLET | Freq: Every day | ORAL | 3 refills | Status: DC

## 2019-07-19 MED ORDER — LISINOPRIL 10 MG PO TABS: 10.0000 mg | ORAL_TABLET | Freq: Every day | ORAL | 3 refills | Status: DC

## 2019-07-19 NOTE — Progress Notes (Signed)
Subjective:     Patient ID: Alan Hodges, male   DOB: [birth year], 73 y.o.   MRN: [id number]  HPI Alan Hodges is here for physical exam.  He has been diagnosed with prostate cancer.  He is considering various treatment options at this time and has follow-up with urology soon.  He has been very diligent in studying treatment options  He has hypertension which has been well controlled.  He continues to stay very active.  He is living currently up [place] outside of [place].  Still a lot of renovations with his house.  He has 21 acres.  He is due for repeat colonoscopy.  Has already had flu vaccine.  Other immunizations up-to-date.  Past Medical History:  Diagnosis Date  . 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 Arthritis   . Chicken pox   . DDD (degenerative disc disease)   . DJD (degenerative joint disease)   . No pertinent past medical history   . Rupture of subscapularis tendon, irreparable 07/01/2012  . Shoulder impingement syndrome, left 07/01/2012   Past Surgical History:  Procedure Laterality Date  . [place]   right  . cataracts Bilateral   . CERVICAL FUSION  1991  . COLONOSCOPY     x2  . KNEE ARTHROPLASTY  1976   left open repair  . KNEE ARTHROSCOPY  [id number]   right kneex3    reports that he has never smoked. He has never used smokeless tobacco. He reports current alcohol use. He reports that he does not use drugs. family history includes Diabetes in his father; Hypertension in his mother. No Known Allergies   Review of Systems  Constitutional: Negative for activity change, appetite change, fatigue and fever.  HENT: Negative for congestion, ear pain and trouble swallowing.   Eyes: Negative for pain and visual disturbance.  Respiratory: Negative for cough, shortness of breath and wheezing.   Cardiovascular: Negative for chest pain and palpitations.  Gastrointestinal: Negative for abdominal distention, abdominal pain, blood in stool,  constipation, diarrhea, nausea, rectal pain and vomiting.  Genitourinary: Negative for dysuria, hematuria and testicular pain.  Musculoskeletal: Negative for arthralgias and joint swelling.  Skin: Negative for rash.  Neurological: Negative for dizziness, syncope and headaches.  Hematological: Negative for adenopathy.  Psychiatric/Behavioral: Negative for confusion and dysphoric mood.       Objective:   Physical Exam Constitutional:      General: He is not in acute distress.    Appearance: He is well-developed.  HENT:     Head: Normocephalic and atraumatic.     Right Ear: External ear normal.     Left Ear: External ear normal.  Eyes:     Conjunctiva/sclera: Conjunctivae normal.     Pupils: Pupils are equal, round, and reactive to light.  Neck:     Musculoskeletal: Normal range of motion and neck supple.     Thyroid: No thyromegaly.  Cardiovascular:     Rate and Rhythm: Normal rate and regular rhythm.     Heart sounds: Normal heart sounds. No murmur.  Pulmonary:     Effort: No respiratory distress.     Breath sounds: No wheezing or rales.  Abdominal:     General: Bowel sounds are normal. There is no distension.     Palpations: Abdomen is soft. There is no mass.     Tenderness: There is no abdominal tenderness. There is no guarding or rebound.  Lymphadenopathy:     Cervical: No cervical adenopathy.  Skin:    Findings:  No rash.  Neurological:     Mental Status: He is alert and oriented to person, place, and time.     Cranial Nerves: No cranial nerve deficit.     Deep Tendon Reflexes: Reflexes normal.        Assessment:     Physical exam.  He has hypertension which is well controlled.  Recently diagnosed prostate cancer.    Plan:     -Obtain follow-up labs.  Will defer PSA since he is getting this through urology. -Set up repeat colonoscopy -Continue regular exercise habits -refilled BP meds for one year.   Alan Post MD [facility] at  [facility]

## 2019-07-19 NOTE — Patient Instructions (Signed)
Preventive Care 71 Years and Older, Male Preventive care refers to lifestyle choices and visits with your health care provider that can promote health and wellness. This includes:  A yearly physical exam. This is also called an annual well check.  Regular dental and eye exams.  Immunizations.  Screening for certain conditions.  Healthy lifestyle choices, such as diet and exercise. What can I expect for my preventive care visit? Physical exam Your health care provider will check:  Height and weight. These may be used to calculate body mass index (BMI), which is a measurement that tells if you are at a healthy weight.  Heart rate and blood pressure.  Your skin for abnormal spots. Counseling Your health care provider may ask you questions about:  Alcohol, tobacco, and drug use.  Emotional well-being.  Home and relationship well-being.  Sexual activity.  Eating habits.  History of falls.  Memory and ability to understand (cognition).  Work and work Statistician. What immunizations do I need?  Influenza (flu) vaccine  This is recommended every year. Tetanus, diphtheria, and pertussis (Tdap) vaccine  You may need a Td booster every 10 years. Varicella (chickenpox) vaccine  You may need this vaccine if you have not already been vaccinated. Zoster (shingles) vaccine  You may need this after age 50. Pneumococcal conjugate (PCV13) vaccine  One dose is recommended after age 24. Pneumococcal polysaccharide (PPSV23) vaccine  One dose is recommended after age 33. Measles, mumps, and rubella (MMR) vaccine  You may need at least one dose of MMR if you were born in 1957 or later. You may also need a second dose. Meningococcal conjugate (MenACWY) vaccine  You may need this if you have certain conditions. Hepatitis A vaccine  You may need this if you have certain conditions or if you travel or work in places where you may be exposed to hepatitis A. Hepatitis B vaccine   You may need this if you have certain conditions or if you travel or work in places where you may be exposed to hepatitis B. Haemophilus influenzae type b (Hib) vaccine  You may need this if you have certain conditions. You may receive vaccines as individual doses or as more than one vaccine together in one shot (combination vaccines). Talk with your health care provider about the risks and benefits of combination vaccines. What tests do I need? Blood tests  Lipid and cholesterol levels. These may be checked every 5 years, or more frequently depending on your overall health.  Hepatitis C test.  Hepatitis B test. Screening  Lung cancer screening. You may have this screening every year starting at age 71 if you have a 30-pack-year history of smoking and currently smoke or have quit within the past 15 years.  Colorectal cancer screening. All adults should have this screening starting at age 71 and continuing until age 54. Your health care provider may recommend screening at age 47 if you are at increased risk. You will have tests every 1-10 years, depending on your results and the type of screening test.  Prostate cancer screening. Recommendations will vary depending on your family history and other risks.  Diabetes screening. This is done by checking your blood sugar (glucose) after you have not eaten for a while (fasting). You may have this done every 1-3 years.  Abdominal aortic aneurysm (AAA) screening. You may need this if you are a current or former smoker.  Sexually transmitted disease (STD) testing. Follow these instructions at home: Eating and drinking  Eat  a diet that includes fresh fruits and vegetables, whole grains, lean protein, and low-fat dairy products. Limit your intake of foods with high amounts of sugar, saturated fats, and salt.  Take vitamin and mineral supplements as recommended by your health care provider.  Do not drink alcohol if your health care provider  tells you not to drink.  If you drink alcohol: ? Limit how much you have to 0-2 drinks a day. ? Be aware of how much alcohol is in your drink. In the [place] one drink equals one 12 oz bottle of beer (355 mL), one 5 oz glass of wine (148 mL), or one 1 oz glass of hard liquor (44 mL). Lifestyle  Take daily care of your teeth and gums.  Stay active. Exercise for at least 30 minutes on 5 or more days each week.  Do not use any products that contain nicotine or tobacco, such as cigarettes, e-cigarettes, and chewing tobacco. If you need help quitting, ask your health care provider.  If you are sexually active, practice safe sex. Use a condom or other form of protection to prevent STIs (sexually transmitted infections).  Talk with your health care provider about taking a low-dose aspirin or statin. What's next?  Visit your health care provider once a year for a well check visit.  Ask your health care provider how often you should have your eyes and teeth checked.  Stay up to date on all vaccines. This information is not intended to replace advice given to you by your health care provider. Make sure you discuss any questions you have with your health care provider. Document Released: 09/20/2015 Document Revised: 08/18/2018 Document Reviewed: 08/18/2018 Elsevier Patient Education  2020 [employer]  

## 2019-07-20 NOTE — Progress Notes (Signed)
GU Location of Tumor / Histology: prostatic adenocarcinoma  If Prostate Cancer, Gleason Score is (3 + 4) and PSA is (4.34). Prostate volume: 33.84 g.  Alan Hodges has been under active surveillance since February of 2016.  Biopsies of prostate (if applicable) revealed:    Past/Anticipated interventions by urology, if any: prostate biopsies, bone scan, MR of lumbar and thoracic spine, all bony areas clear, small left iliac lesion noted also clear,  referral to Dr. Tammi Klippel  Past/Anticipated interventions by medical oncology, if any: no  Weight changes, if any: denies   Bowel/Bladder complaints, if any: IPSS 1 related to nocturia. SHIM 3 due to inactivity. Denies dysuria, hematuria or leakage.    Nausea/Vomiting, if any: denies  Pain issues, if any:  Arthritic knee pain.  SAFETY ISSUES:  Prior radiation? Denies   Pacemaker/ICD? denies  Possible current pregnancy? no, male patient  Is the patient on methotrexate? denies  Current Complaints / other details:  73 year old. Single.

## 2019-07-21 ENCOUNTER — Ambulatory Visit
Admission: RE | Admit: 2019-07-21 | Discharge: 2019-07-21 | Disposition: A | Discharge status: Home / Self Care | Payer: Medicare Other | Source: Ambulatory Visit | Attending: Radiation Oncology | Admitting: Radiation Oncology

## 2019-07-21 ENCOUNTER — Telehealth: Payer: Self-pay | Admitting: *Deleted

## 2019-07-21 ENCOUNTER — Other Ambulatory Visit: Payer: Self-pay

## 2019-07-21 ENCOUNTER — Encounter: Payer: Self-pay | Admitting: Radiation Oncology

## 2019-07-21 VITALS — Ht 72.0 in | Wt 181.0 lb

## 2019-07-21 DIAGNOSIS — R972 Elevated prostate specific antigen [PSA]: Secondary | ICD-10-CM | POA: Diagnosis not present

## 2019-07-21 DIAGNOSIS — C61 Malignant neoplasm of prostate: Secondary | ICD-10-CM | POA: Diagnosis not present

## 2019-07-21 HISTORY — DX: Essential (primary) hypertension: I10

## 2019-07-21 HISTORY — DX: Malignant neoplasm of prostate: C61

## 2019-07-21 NOTE — Progress Notes (Signed)
Radiation Oncology         [phone] ________________________________  Initial outpatient Consultation - Conducted via MyChart due to current COVID-19 concerns for limiting patient exposure  Name: Alan Hodges MRN: [id number]  Date: 07/21/2019  DOB: [birth year]  [id number], [name], MD  Franchot Gallo, MD   REFERRING PHYSICIAN: Franchot Gallo, MD  DIAGNOSIS: 73 y.o. gentleman with Stage T1c adenocarcinoma of the prostate with Gleason score of 3+4, and PSA of 4.34.    ICD-10-CM   1. Malignant neoplasm of prostate ([place])  C61     HISTORY OF PRESENT ILLNESS: Alan Hodges is a 73 y.o. male with a diagnosis of prostate cancer. He was noted to have an elevated PSA of 4.79 by his primary care physician, Dr. Elease Hashimoto.  Accordingly, he was referred for evaluation in urology by Dr. Mikey Bussing in early 2016,  digital rectal examination was performed at that time revealing no nodules.  He underwent initial biopsy on 10/24/2014, which revealed one core with Gleason 3+3 adenocarcinoma. Oncotype DX study was performed on this sample and revealed the likelihood of favorable pathology at 86%. They opted for active surveillance at that time.  He underwent surveillance prostate MRI on 03/29/2015, which showed a 7 mm focus of hypointensity in the lateral right base to mid gland without evidence of any spread. He proceeded to repeat biopsy on 04/05/2019, which again showed Gleason 3+3 in one core.  PSA 05/29/16 - 4.93 12/23/16 - 5.32 08/06/17 - 5.49 02/14/18 - 6.23 09/30/18 - 4.54 05/05/19 - 4.34  He underwent repeat prostate MRI on 05/05/2019, which showed a PIRADS-4 13 mm lesion of the right peripheral basilar prostate and a small left iliac lesion. His DRE remained normal.  The patient proceeded to MRI-fusion biopsy with on 06/05/2019.  The prostate volume measured 33.84 cc.  Out of 16 core biopsies, 7 were positive.  The maximum Gleason score was 3+4, and this was seen in all 4 cores from  the ROI MRI lesion, right base, right mid (two small foci), and right base lateral.  He underwent bone scan on 06/19/2019, which showed: indeterminate areas of increased activity within the thoracic and lumbar spine.  He proceeded to thoracic and lumbar spine MRIs on 07/14/2019. Both scans showed no aggressive osseous lesion to suggest metastatic disease, findings seen on recent bone scan likely reflect degenerative changes.  The patient reviewed the biopsy results with his urologist and he has kindly been referred today for discussion of potential radiation treatment options.   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   Shoulder impingement syndrome, left 07/01/2012      PAST SURGICAL HISTORY: Past Surgical History:  Procedure Laterality Date   BUNIONECTOMY  1994   right   cataracts Bilateral    CERVICAL FUSION  1991   COLONOSCOPY     x2   KNEE ARTHROPLASTY  1976   left open repair x3   KNEE ARTHROSCOPY  [id number]   right [id number]   PROSTATE BIOPSY     SHOULDER SURGERY Left 2008    FAMILY HISTORY:  Family History  Problem Relation Age of Onset   Hypertension Mother    Diabetes Father    Cancer Neg Hx    Breast cancer Neg Hx    Prostate cancer Neg Hx    Colon cancer Neg Hx     SOCIAL HISTORY:  Social History   Socioeconomic History   Marital status: Single    Spouse name: Not on file   Number of children: Not on file   Years of education: Not on file   Highest education level: Not on file  Occupational History   Occupation: retired in 2014  [place] resource strain: Not on file   Food insecurity    Worry: Not on file    Inability: Not on Lexicographer needs    Medical: Not on file    Non-medical: Not on file  Tobacco Use   Smoking status: Never Smoker   Smokeless tobacco: Never Used  Substance and Sexual Activity   Alcohol use: Yes    Comment: daily   Drug use: No   Sexual activity: Not Currently  Lifestyle   Physical activity    Days per week: Not on file    Minutes per session: Not on file   Stress: Not on file  Relationships   Social connections    Talks on phone: Not on file    Gets together: Not on file    Attends religious service: Not on file    Active member of club or organization: Not on file    Attends meetings of clubs or organizations: Not on file    Relationship status: Not on file   Intimate partner violence    Fear of current or ex partner: Not on file    Emotionally abused: Not on file    Physically abused: Not on file    Forced sexual activity: Not on file  Other Topics Concern   Not on file  Social History Narrative   Not on file    ALLERGIES: Patient has no known allergies.  MEDICATIONS:  Current Outpatient Medications  Medication Sig Dispense Refill   amLODipine (NORVASC) 5 MG tablet Take 1 tablet (5 mg total) by mouth daily. 90 tablet 3   ibuprofen (ADVIL,MOTRIN) 200 MG tablet Take 200 mg by mouth every 8 (eight) hours as needed (just as needed).     lisinopril (ZESTRIL) 10 MG tablet Take 1 tablet (10 mg total) by mouth daily. 90 tablet 3   Melatonin 3 MG TABS Take by mouth.     No current facility-administered medications for this encounter.     REVIEW OF SYSTEMS:  On review of systems, the patient reports that he is doing well overall. He denies any chest pain, shortness of breath, cough, fevers, chills, night sweats, unintended weight changes. He denies any bowel disturbances, and denies abdominal pain, nausea or vomiting. He denies any new  musculoskeletal or joint aches or pains. His IPSS was 1, indicating very mild urinary symptoms. He reports some nocturia and denies any other issues. His SHIM was 3 due to activity, indicating he may have erectile dysfunction. A complete review of systems is obtained and is otherwise negative.    PHYSICAL EXAM:  Wt Readings from Last 3 Encounters:  07/21/19 181 lb (82.1 kg)  07/19/19 181 lb 4.8 oz (82.2 kg)  10/07/18 186 lb 9.6 oz (84.6 kg)   Temp Readings from Last 3 Encounters:  07/19/19 98.2 F (36.8 C) (Temporal)  10/07/18 97.7 F (36.5  C) (Oral)  02/14/18 97.9 F (36.6 C) (Oral)   BP Readings from Last 3 Encounters:  07/19/19 130/82  10/07/18 (!) 160/92  02/14/18 (!) 150/90   Pulse Readings from Last 3 Encounters:  07/19/19 61  10/07/18 85  02/14/18 74   Pain Assessment Pain Score: 0-No pain(occasional arthritic knee pain)/10  In general this is a well appearing gentleman in no acute distress. He's alert and oriented x4 and appropriate throughout the examination. Cardiopulmonary assessment is negative for acute distress and he exhibits normal effort.    KPS = 100  100 - Normal; no complaints; no evidence of disease. 90   - Able to carry on normal activity; minor signs or symptoms of disease. 80   - Normal activity with effort; some signs or symptoms of disease. 34   - Cares for self; unable to carry on normal activity or to do active work. 60   - Requires occasional assistance, but is able to care for most of his personal needs. 50   - Requires considerable assistance and frequent medical care. 32   - Disabled; requires special care and assistance. 82   - Severely disabled; hospital admission is indicated although death not imminent. 50   - Very sick; hospital admission necessary; active supportive treatment necessary. 10   - Moribund; fatal processes progressing rapidly. 0     - Dead  Karnofsky DA, Abelmann Waldo, Craver LS and Burchenal [facility] [phone]) The use of the  nitrogen mustards in the palliative treatment of carcinoma: with particular reference to bronchogenic carcinoma Cancer 1 634-56  LABORATORY DATA:  Lab Results  Component Value Date   WBC 5.7 07/19/2019   HGB 15.8 07/19/2019   HCT 46.1 07/19/2019   MCV 94.1 07/19/2019   PLT 252.0 07/19/2019   Lab Results  Component Value Date   NA 137 07/19/2019   K 4.1 07/19/2019   CL 102 07/19/2019   CO2 28 07/19/2019   Lab Results  Component Value Date   ALT 19 07/19/2019   AST 18 07/19/2019   ALKPHOS 77 07/19/2019   BILITOT 0.9 07/19/2019     RADIOGRAPHY: Mr Thoracic Spine W Wo Contrast  Result Date: 07/15/2019 CLINICAL DATA:  Abnormal bone scan recently on 06/19/2019. Heterogeneous increased radiotracer uptake in the thoracolumbar spine. History of prostate cancer. EXAM: MRI THORACIC WITHOUT AND WITH CONTRAST TECHNIQUE: Multiplanar and multiecho pulse sequences of the thoracic spine were obtained without and with intravenous contrast. CONTRAST:  54mL MULTIHANCE GADOBENATE DIMEGLUMINE 529 MG/ML IV SOLN COMPARISON:  None. FINDINGS: MRI THORACIC SPINE FINDINGS Alignment:  Physiologic. Vertebrae: Minimal grade 1 anterolisthesis of T10 on T11. Cord:  Normal signal and morphology. Paraspinal and other soft tissues: No acute paraspinal abnormality. Disc levels: Disc spaces: Degenerative disease with disc height loss at C7-T1, T1-2, T2-3, T5-6, T6-7, T7-8 with reactive endplate edema, [id number], [id number] and T11-12. T1-T2: Minimal broad-based disc bulge. No foraminal stenosis. No central canal stenosis. T2-T3: Mild broad-based disc bulge. Mild bilateral foraminal stenosis. No central canal stenosis. T3-T4: No disc protrusion, foraminal stenosis or central canal stenosis. T4-T5: Mild broad-based disc bulge. No foraminal or central canal stenosis. T5-T6: Minimal broad-based disc bulge. No foraminal or central canal stenosis. T6-T7: Mild broad-based disc bulge. No foraminal or central canal stenosis. T7-T8: Mild  broad-based disc bulge. No foraminal or central canal stenosis. T8-T9: Mild broad-based disc bulge. Mild bilateral facet arthropathy. Mild bilateral foraminal stenosis. No central canal stenosis. T9-T10: No disc protrusion, foraminal stenosis or central canal stenosis. T10-T11: Mild  broad-based disc bulge. Moderate bilateral facet arthropathy. Moderate right and severe left foraminal stenosis. No central canal stenosis. T11-T12: Mild broad-based disc bulge. Moderate bilateral facet arthropathy. Severe right and moderate left foraminal stenosis. No central canal stenosis. IMPRESSION: 1. No aggressive osseous lesion to suggest metastatic disease. 2. Thoracic spine spondylosis as described above. 3.  No acute osseous injury of the thoracic spine. Electronically Signed   By: Kathreen Devoid   On: 07/15/2019 15:25   Mr Lumbar Spine W Wo Contrast  Result Date: 07/15/2019 CLINICAL DATA:  Thoracolumbar spine pain. EXAM: MRI LUMBAR SPINE WITHOUT AND WITH CONTRAST TECHNIQUE: Multiplanar and multiecho pulse sequences of the lumbar spine were obtained without and with intravenous contrast. CONTRAST:  47mL MULTIHANCE GADOBENATE DIMEGLUMINE 529 MG/ML IV SOLN COMPARISON:  Bone scan 06/19/2019 FINDINGS: Segmentation:  Standard. Alignment: 2 mm anterolisthesis of L3 on L4 and L4 on L5 secondary to facet disease. Vertebrae: No fracture, evidence of discitis, or aggressive bone lesion. Subcortical irregularity of the spinous processes from L3 through L5. Conus medullaris and cauda equina: Conus extends to the L1 level. Conus and cauda equina appear normal. Paraspinal and other soft tissues: No acute paraspinal abnormality. Disc levels: Disc spaces: Degenerative disease with disc height loss at T11-12 and L5-S1. Disc desiccation at L3-4 and L4-5. T11-12: Mild broad-based disc osteophyte complex. Mild bilateral facet arthropathy. Mild bilateral foraminal stenosis. No central canal stenosis. T12-L1: No significant disc bulge. No  evidence of neural foraminal stenosis. No central canal stenosis. L1-L2: No significant disc bulge. No evidence of neural foraminal stenosis. No central canal stenosis. L2-L3: Minimal broad-based disc bulge. Mild bilateral facet arthropathy. No evidence of neural foraminal stenosis. No central canal stenosis. L3-L4: Broad-based disc osteophyte complex with a more focal right paracentral component flattening the ventral thecal sac. Severe bilateral facet arthropathy with bilateral facet effusions. Severe spinal stenosis. Mild bilateral foraminal stenosis. L4-L5: Broad-based disc bulge. Severe bilateral facet arthropathy with ligamentum flavum infolding and bilateral facet effusions. Moderate right foraminal stenosis. No left foraminal stenosis. Right subarticular recess stenosis. Mild spinal stenosis. L5-S1: Mild broad-based disc bulge with a small central disc protrusion. Mild bilateral facet arthropathy. Moderate bilateral foraminal stenosis. No central canal stenosis. IMPRESSION: 1. No aggressive osseous lesion to suggest metastatic disease. Finding seen on recent bone scan likely reflect degenerative changes. 2. At L3-4 there is a broad-based disc osteophyte complex with a more focal right paracentral component flattening the ventral thecal sac. Severe bilateral facet arthropathy with bilateral facet effusions. Severe spinal stenosis. Mild bilateral foraminal stenosis. 3. At L4-5 there is a broad-based disc bulge. Severe bilateral facet arthropathy with ligamentum flavum infolding and bilateral facet effusions. Moderate right foraminal stenosis. No left foraminal stenosis. Right subarticular recess stenosis. Mild spinal stenosis. 4. At L5-S1 there is a mild broad-based disc bulge with a small central disc protrusion. Mild bilateral facet arthropathy. Moderate bilateral foraminal stenosis. Electronically Signed   By: Kathreen Devoid   On: 07/15/2019 11:52      IMPRESSION/PLAN: This visit was conducted via  Visit to spare the patient unnecessary potential exposure in the healthcare setting during the current COVID-19 pandemic. The patient is a 73 y.o. gentleman with Stage T1c adenocarcinoma of the prostate with Gleason Score of 3+4, and PSA of 4.34.  We discussed the patient's workup and outlined the nature of prostate cancer in this setting. The patient's T stage, Gleason's score, and PSA put him into the favorable intermediate risk group. Accordingly, he is eligible for a variety of  potential treatment options including brachytherapy, external radiation, or prostatectomy. We discussed the available radiation techniques, and focused on the details and logistics and delivery. We discussed and outlined the risks, benefits, short and long-term effects associated with radiotherapy and compared and contrasted these with prostatectomy. We discussed the role of SpaceOAR in reducing the rectal toxicity associated with radiotherapy.   At the end of the conversation the patient is interested in moving forward with second opinion at [facility].  He is leaning toward prostate seed implant.  Given current concerns for patient exposure during the COVID-19 pandemic, this encounter was conducted via video-enabled MyChart visit.  The patient has given verbal consent for this type of encounter. The time spent during this encounter was 45 minutes. The attendants for this meeting include Tyler Pita MD,and patient Parish Pepitone and his significant other During the encounter, Tyler Pita MD was located at [facility] Radiation Oncology Department.  Patient Mahamadou Heyder his signficant other were located at home.   ------------------------------------------------   Tyler Pita, MD [place]campment Director and Director of Stereotactic Radiosurgery Direct Dial: [phone]   Fax: [phone] [email or web address]   Skype   [email or web address]   This document serves as a record of  services personally performed by Tyler Pita, MD. It was created on his behalf by Wilburn Mylar, a trained medical scribe. The creation of this record is based on the scribe's personal observations and the provider's statements to them. This document has been checked and approved by the attending provider.

## 2019-07-21 NOTE — Telephone Encounter (Signed)
Called patient to inform of [place] visit with Dr. Kevin Fenton on 07-26-19 - arrival time- 1:30 pm, spoke with patient and he is aware of this appt.

## 2019-07-21 NOTE — Telephone Encounter (Signed)
Called patient to inform of appt. with Dr. Kevin Fenton on 07/26/19 - arrival time- 1:30 pm , address- [address] in [place], [place]. [phone], ph. no. [phone], lvm for a return call

## 2019-07-26 DIAGNOSIS — C61 Malignant neoplasm of prostate: Secondary | ICD-10-CM | POA: Diagnosis not present

## 2019-08-07 ENCOUNTER — Telehealth: Payer: Self-pay | Admitting: Medical Oncology

## 2019-08-07 NOTE — Telephone Encounter (Signed)
Left message requesting a return call as follow up to consult with Dr. Tammi Klippel 11/13.

## 2019-08-08 ENCOUNTER — Telehealth: Payer: Self-pay | Admitting: Medical Oncology

## 2019-08-08 NOTE — Telephone Encounter (Signed)
Patient returned my call regarding radiation consult. He states the consult with Dr. Tammi Klippel went very well and he has also seen Dr. Truman Hayward at [facility] for a 2nd opinion. He is not interested in surgery and has decided on brachytherapy.   He is aware Enid Derry will contact him with appointments. I asked him to call me with questions or concerns. He voiced understanding and was very appreciative of my call.

## 2019-08-09 ENCOUNTER — Telehealth: Payer: Self-pay | Admitting: *Deleted

## 2019-08-09 NOTE — Telephone Encounter (Signed)
Called patient to ask questions, spoke with patient and will update Friday Dec. 4

## 2019-08-11 ENCOUNTER — Telehealth: Payer: Self-pay | Admitting: *Deleted

## 2019-08-11 NOTE — Telephone Encounter (Signed)
CALLED PATIENT TO INFORM OF PRE-SEED APPT. ON 09-07-19, SPOKE WITH PATIENT AND HE IS AWARE OF THIS APPT.

## 2019-08-18 ENCOUNTER — Encounter: Payer: Self-pay | Admitting: Family Medicine

## 2019-08-21 ENCOUNTER — Telehealth: Payer: Self-pay | Admitting: *Deleted

## 2019-08-21 NOTE — Telephone Encounter (Signed)
CALLED PATIENT TO INFORM OF IMPLANT DATE OF 09/28/19, SPOKE WITH PATIENT AND HE IS AWARE OF THIS DATE

## 2019-09-06 ENCOUNTER — Telehealth: Payer: Self-pay | Admitting: *Deleted

## 2019-09-06 NOTE — Telephone Encounter (Signed)
Called patient to remind of pre-seed appts. for 09-07-19, lvm for a return call

## 2019-09-07 ENCOUNTER — Ambulatory Visit (HOSPITAL_COMMUNITY)
Admission: RE | Admit: 2019-09-07 | Discharge: 2019-09-07 | Disposition: A | Discharge status: Home / Self Care | Payer: Medicare Other | Source: Ambulatory Visit | Attending: Urology | Admitting: Urology

## 2019-09-07 ENCOUNTER — Ambulatory Visit
Admission: RE | Admit: 2019-09-07 | Discharge: 2019-09-07 | Disposition: A | Discharge status: Home / Self Care | Payer: Medicare Other | Source: Ambulatory Visit | Attending: Urology | Admitting: Urology

## 2019-09-07 ENCOUNTER — Encounter: Payer: Self-pay | Admitting: Medical Oncology

## 2019-09-07 ENCOUNTER — Encounter (HOSPITAL_COMMUNITY)
Admission: RE | Admit: 2019-09-07 | Discharge: 2019-09-07 | Disposition: A | Discharge status: Home / Self Care | Payer: Medicare Other | Source: Ambulatory Visit | Attending: Urology | Admitting: Urology

## 2019-09-07 ENCOUNTER — Other Ambulatory Visit: Payer: Self-pay

## 2019-09-07 ENCOUNTER — Ambulatory Visit
Admission: RE | Admit: 2019-09-07 | Discharge: 2019-09-07 | Disposition: A | Discharge status: Home / Self Care | Payer: Medicare Other | Source: Ambulatory Visit | Attending: Radiation Oncology | Admitting: Radiation Oncology

## 2019-09-07 DIAGNOSIS — Z01818 Encounter for other preprocedural examination: Secondary | ICD-10-CM | POA: Insufficient documentation

## 2019-09-07 DIAGNOSIS — C61 Malignant neoplasm of prostate: Secondary | ICD-10-CM | POA: Insufficient documentation

## 2019-09-07 DIAGNOSIS — I10 Essential (primary) hypertension: Secondary | ICD-10-CM | POA: Diagnosis not present

## 2019-09-07 NOTE — Progress Notes (Signed)
  Radiation Oncology         [id number]) [phone] ________________________________  Name: Alan Hodges MRN: [id number]  Date: 09/07/2019  DOB: Feb 23, 1946  SIMULATION AND TREATMENT PLANNING NOTE PUBIC ARCH STUDY  [id number], [name], MD  [name], MD  DIAGNOSIS: 73 y.o. gentleman with Stage T1c adenocarcinoma of the prostate with Gleason score of 3+4, and PSA of 4.34     ICD-10-CM   1. Prostate cancer ([place])  C61     COMPLEX SIMULATION:  The patient presented today for evaluation for possible prostate seed implant. He was brought to the radiation planning suite and placed supine on the CT couch. A 3-dimensional image study set was obtained in upload to the planning computer. There, on each axial slice, I contoured the prostate gland. Then, using three-dimensional radiation planning tools I reconstructed the prostate in view of the structures from the transperineal needle pathway to assess for possible pubic arch interference. In doing so, I did not appreciate any pubic arch interference. Also, the patient's prostate volume was estimated based on the drawn structure. The volume was 33 cc.  Given the pubic arch appearance and prostate volume, patient remains a good candidate to proceed with prostate seed implant. Today, he freely provided informed written consent to proceed.    PLAN: The patient will undergo prostate seed implant.   ________________________________  Sheral Apley. Tammi Klippel, M.D.

## 2019-09-11 ENCOUNTER — Other Ambulatory Visit: Payer: Self-pay | Admitting: Urology

## 2019-09-21 ENCOUNTER — Other Ambulatory Visit: Payer: Self-pay | Admitting: Urology

## 2019-09-21 ENCOUNTER — Telehealth: Payer: Self-pay | Admitting: *Deleted

## 2019-09-21 DIAGNOSIS — C61 Malignant neoplasm of prostate: Secondary | ICD-10-CM

## 2019-09-21 NOTE — Telephone Encounter (Signed)
Called patient to inform of lab and Covid testing for 09-25-19, spoke with patient and he is aware of these appts.

## 2019-09-22 ENCOUNTER — Other Ambulatory Visit: Payer: Self-pay | Admitting: Urology

## 2019-09-22 ENCOUNTER — Telehealth: Payer: Self-pay | Admitting: *Deleted

## 2019-09-22 ENCOUNTER — Other Ambulatory Visit (HOSPITAL_COMMUNITY): Payer: Medicare Other

## 2019-09-22 DIAGNOSIS — C61 Malignant neoplasm of prostate: Secondary | ICD-10-CM | POA: Diagnosis not present

## 2019-09-22 NOTE — Telephone Encounter (Signed)
CALLED PATIENT TO REMIND OF LAB AND COVID TESTING FOR 09-25-19, SPOKE WITH PATIENT AND HE IS AWARE OF THESE APPTS.

## 2019-09-25 ENCOUNTER — Other Ambulatory Visit (HOSPITAL_COMMUNITY)
Admission: RE | Admit: 2019-09-25 | Discharge: 2019-09-25 | Disposition: A | Discharge status: Home / Self Care | Payer: Medicare Other | Source: Ambulatory Visit | Attending: Urology | Admitting: Urology

## 2019-09-25 ENCOUNTER — Other Ambulatory Visit: Payer: Self-pay

## 2019-09-25 ENCOUNTER — Encounter (HOSPITAL_COMMUNITY)
Admission: RE | Admit: 2019-09-25 | Discharge: 2019-09-25 | Disposition: A | Discharge status: Home / Self Care | Payer: Medicare Other | Source: Ambulatory Visit | Attending: Urology | Admitting: Urology

## 2019-09-25 ENCOUNTER — Encounter (HOSPITAL_BASED_OUTPATIENT_CLINIC_OR_DEPARTMENT_OTHER): Payer: Self-pay | Admitting: Urology

## 2019-09-25 DIAGNOSIS — Z01812 Encounter for preprocedural laboratory examination: Secondary | ICD-10-CM | POA: Insufficient documentation

## 2019-09-25 DIAGNOSIS — Z20822 Contact with and (suspected) exposure to covid-19: Secondary | ICD-10-CM | POA: Diagnosis not present

## 2019-09-25 LAB — CBC
HCT: 45.9 % (ref 39.0–52.0)
Hemoglobin: 15.4 g/dL (ref 13.0–17.0)
MCH: 31.6 pg (ref 26.0–34.0)
MCHC: 33.6 g/dL (ref 30.0–36.0)
MCV: 94.1 fL (ref 80.0–100.0)
Platelets: 266 10*3/uL (ref 150–400)
RBC: 4.88 MIL/uL (ref 4.22–5.81)
RDW: 12.3 % (ref 11.5–15.5)
WBC: 4.5 10*3/uL (ref 4.0–10.5)
nRBC: 0 % (ref 0.0–0.2)

## 2019-09-25 LAB — COMPREHENSIVE METABOLIC PANEL
ALT: 20 U/L (ref 0–44)
AST: 18 U/L (ref 15–41)
Albumin: 4.1 g/dL (ref 3.5–5.0)
Alkaline Phosphatase: 71 U/L (ref 38–126)
Anion gap: 7 (ref 5–15)
BUN: 24 mg/dL — ABNORMAL HIGH (ref 8–23)
CO2: 25 mmol/L (ref 22–32)
Calcium: 8.8 mg/dL — ABNORMAL LOW (ref 8.9–10.3)
Chloride: 106 mmol/L (ref 98–111)
Creatinine, Ser: 0.82 mg/dL (ref 0.61–1.24)
GFR calc Af Amer: >60 mL/min (ref >60)
GFR calc non Af Amer: >60 mL/min (ref >60)
Glucose, Bld: 105 mg/dL — ABNORMAL HIGH (ref 70–99)
Potassium: 4.6 mmol/L (ref 3.5–5.1)
Sodium: 138 mmol/L (ref 135–145)
Total Bilirubin: 0.8 mg/dL (ref 0.3–1.2)
Total Protein: 7.1 g/dL (ref 6.5–8.1)

## 2019-09-25 LAB — APTT: aPTT: 28 seconds (ref 24–36)

## 2019-09-25 LAB — PROTIME-INR
INR: 1 (ref 0.8–1.2)
Prothrombin Time: 13.2 seconds (ref 11.4–15.2)

## 2019-09-25 NOTE — Progress Notes (Signed)
Spoke w/ via phone for pre-op interview---Breon Lab needs dos----  none            Lab results------lab appt for cbc, cmet, pt, ptt 09-25-2019 at 130 pm, ekg 09-07-2019 chart/epic, chest xray 2 view 09-07-2019 chart/epic COVID test ------09-25-2019 Arrive at -------1145 pm 09-28-2019  NPO after ------midnight food, clear liquids until 745 am then npo Medications to take morning of surgery -----amlodipine Diabetic medication -----n/a Patient Special Instructions ----- Pre-Op special Istructions ----- Patient verbalized understanding of instructions that were given at this phone interview. Patient denies shortness of breath, chest pain, fever, cough a this phone interview.

## 2019-09-26 LAB — NOVEL CORONAVIRUS, NAA (HOSP ORDER, SEND-OUT TO REF LAB; TAT 18-24 HRS): SARS-CoV-2, NAA: NOT DETECTED

## 2019-09-27 ENCOUNTER — Telehealth: Payer: Self-pay | Admitting: *Deleted

## 2019-09-27 NOTE — Telephone Encounter (Signed)
Called patient to remind of procedure for 08-07-20, lvm for a return call °

## 2019-09-28 ENCOUNTER — Encounter (HOSPITAL_BASED_OUTPATIENT_CLINIC_OR_DEPARTMENT_OTHER): Payer: Self-pay | Admitting: Urology

## 2019-09-28 ENCOUNTER — Other Ambulatory Visit: Payer: Self-pay

## 2019-09-28 ENCOUNTER — Encounter (HOSPITAL_BASED_OUTPATIENT_CLINIC_OR_DEPARTMENT_OTHER)
Admission: RE | Disposition: A | Discharge status: Home / Self Care | Payer: Self-pay | Source: Ambulatory Visit | Attending: Urology

## 2019-09-28 ENCOUNTER — Ambulatory Visit (HOSPITAL_BASED_OUTPATIENT_CLINIC_OR_DEPARTMENT_OTHER): Payer: Medicare Other | Admitting: Anesthesiology

## 2019-09-28 ENCOUNTER — Ambulatory Visit (HOSPITAL_COMMUNITY): Payer: Medicare Other

## 2019-09-28 ENCOUNTER — Ambulatory Visit (HOSPITAL_BASED_OUTPATIENT_CLINIC_OR_DEPARTMENT_OTHER)
Admission: RE | Admit: 2019-09-28 | Discharge: 2019-09-28 | Disposition: A | Discharge status: Home / Self Care | Payer: Medicare Other | Source: Ambulatory Visit | Attending: Urology | Admitting: Urology

## 2019-09-28 DIAGNOSIS — C61 Malignant neoplasm of prostate: Secondary | ICD-10-CM | POA: Diagnosis not present

## 2019-09-28 DIAGNOSIS — I10 Essential (primary) hypertension: Secondary | ICD-10-CM | POA: Diagnosis not present

## 2019-09-28 DIAGNOSIS — Z79899 Other long term (current) drug therapy: Secondary | ICD-10-CM | POA: Diagnosis not present

## 2019-09-28 DIAGNOSIS — M1990 Unspecified osteoarthritis, unspecified site: Secondary | ICD-10-CM | POA: Diagnosis not present

## 2019-09-28 DIAGNOSIS — Z01818 Encounter for other preprocedural examination: Secondary | ICD-10-CM

## 2019-09-28 DIAGNOSIS — M7542 Impingement syndrome of left shoulder: Secondary | ICD-10-CM | POA: Diagnosis not present

## 2019-09-28 HISTORY — PX: SPACE OAR INSTILLATION: SHX6769

## 2019-09-28 HISTORY — PX: RADIOACTIVE SEED IMPLANT: SHX5150

## 2019-09-28 HISTORY — DX: Other complications of anesthesia, initial encounter: T88.59XA

## 2019-09-28 SURGERY — INSERTION, RADIATION SOURCE, PROSTATE
Anesthesia: General

## 2019-09-28 MED ORDER — LACTATED RINGERS IV SOLN
INTRAVENOUS | Status: DC
  Filled 2019-09-28: qty 1000

## 2019-09-28 MED ORDER — DEXAMETHASONE SODIUM PHOSPHATE 10 MG/ML IJ SOLN
INTRAMUSCULAR | Status: DC | PRN
  Administered 2019-09-28 (×2): 5 mg via INTRAVENOUS

## 2019-09-28 MED ORDER — MIDAZOLAM HCL 2 MG/2ML IJ SOLN
INTRAMUSCULAR | Status: AC
  Filled 2019-09-28: qty 2

## 2019-09-28 MED ORDER — LIDOCAINE 2% (20 MG/ML) 5 ML SYRINGE
INTRAMUSCULAR | Status: DC | PRN
  Administered 2019-09-28: 100 mg via INTRAVENOUS

## 2019-09-28 MED ORDER — OXYCODONE HCL 5 MG PO TABS
5.0000 mg | ORAL_TABLET | Freq: Once | ORAL | Status: DC | PRN
  Filled 2019-09-28: qty 1

## 2019-09-28 MED ORDER — GLYCOPYRROLATE PF 0.2 MG/ML IJ SOSY
PREFILLED_SYRINGE | INTRAMUSCULAR | Status: AC
  Filled 2019-09-28: qty 1

## 2019-09-28 MED ORDER — ACETAMINOPHEN 10 MG/ML IV SOLN
1000.0000 mg | Freq: Once | INTRAVENOUS | Status: DC | PRN
  Filled 2019-09-28: qty 100

## 2019-09-28 MED ORDER — OXYCODONE HCL 5 MG/5ML PO SOLN
5.0000 mg | Freq: Once | ORAL | Status: DC | PRN
  Filled 2019-09-28: qty 5

## 2019-09-28 MED ORDER — FLEET ENEMA 7-19 GM/118ML RE ENEM
1.0000 | ENEMA | Freq: Once | RECTAL | Status: DC
  Filled 2019-09-28: qty 1

## 2019-09-28 MED ORDER — ONDANSETRON HCL 4 MG/2ML IJ SOLN
INTRAMUSCULAR | Status: AC
  Filled 2019-09-28: qty 2

## 2019-09-28 MED ORDER — LIDOCAINE 2% (20 MG/ML) 5 ML SYRINGE
INTRAMUSCULAR | Status: AC
  Filled 2019-09-28: qty 5

## 2019-09-28 MED ORDER — EPHEDRINE SULFATE-NACL 50-0.9 MG/10ML-% IV SOSY
PREFILLED_SYRINGE | INTRAVENOUS | Status: DC | PRN
  Administered 2019-09-28 (×2): 15 mg via INTRAVENOUS
  Administered 2019-09-28: 10 mg via INTRAVENOUS
  Administered 2019-09-28 (×2): 15 mg via INTRAVENOUS

## 2019-09-28 MED ORDER — PHENYLEPHRINE HCL (PRESSORS) 10 MG/ML IV SOLN
INTRAVENOUS | Status: AC
  Filled 2019-09-28: qty 4

## 2019-09-28 MED ORDER — EPHEDRINE 5 MG/ML INJ
INTRAVENOUS | Status: AC
  Filled 2019-09-28: qty 10

## 2019-09-28 MED ORDER — PROPOFOL 10 MG/ML IV BOLUS
INTRAVENOUS | Status: AC
  Filled 2019-09-28: qty 20

## 2019-09-28 MED ORDER — FENTANYL CITRATE (PF) 100 MCG/2ML IJ SOLN
INTRAMUSCULAR | Status: AC
  Filled 2019-09-28: qty 2

## 2019-09-28 MED ORDER — ONDANSETRON HCL 4 MG/2ML IJ SOLN
INTRAMUSCULAR | Status: DC | PRN
  Administered 2019-09-28: 4 mg via INTRAVENOUS

## 2019-09-28 MED ORDER — IOHEXOL 300 MG/ML  SOLN
INTRAMUSCULAR | Status: DC | PRN
  Administered 2019-09-28: 7 mL

## 2019-09-28 MED ORDER — FENTANYL CITRATE (PF) 100 MCG/2ML IJ SOLN
25.0000 ug | INTRAMUSCULAR | Status: DC | PRN
  Filled 2019-09-28: qty 1

## 2019-09-28 MED ORDER — CEFAZOLIN SODIUM-DEXTROSE 2-4 GM/100ML-% IV SOLN
2.0000 g | Freq: Once | INTRAVENOUS | Status: AC
  Administered 2019-09-28: 14:00:00 2 g via INTRAVENOUS
  Filled 2019-09-28: qty 100

## 2019-09-28 MED ORDER — PHENYLEPHRINE 40 MCG/ML (10ML) SYRINGE FOR IV PUSH (FOR BLOOD PRESSURE SUPPORT)
PREFILLED_SYRINGE | INTRAVENOUS | Status: DC | PRN
  Administered 2019-09-28 (×2): 80 ug via INTRAVENOUS

## 2019-09-28 MED ORDER — FENTANYL CITRATE (PF) 100 MCG/2ML IJ SOLN
INTRAMUSCULAR | Status: DC | PRN
  Administered 2019-09-28: 75 ug via INTRAVENOUS

## 2019-09-28 MED ORDER — PROMETHAZINE HCL 25 MG/ML IJ SOLN
6.2500 mg | INTRAMUSCULAR | Status: DC | PRN
  Filled 2019-09-28: qty 1

## 2019-09-28 MED ORDER — PROPOFOL 10 MG/ML IV BOLUS
INTRAVENOUS | Status: DC | PRN
  Administered 2019-09-28: 170 mg via INTRAVENOUS

## 2019-09-28 MED ORDER — PHENYLEPHRINE 40 MCG/ML (10ML) SYRINGE FOR IV PUSH (FOR BLOOD PRESSURE SUPPORT)
PREFILLED_SYRINGE | INTRAVENOUS | Status: AC
  Filled 2019-09-28: qty 10

## 2019-09-28 MED ORDER — DEXAMETHASONE SODIUM PHOSPHATE 10 MG/ML IJ SOLN
INTRAMUSCULAR | Status: AC
  Filled 2019-09-28: qty 1

## 2019-09-28 MED ORDER — WHITE PETROLATUM EX OINT
TOPICAL_OINTMENT | CUTANEOUS | Status: AC
  Filled 2019-09-28: qty 5

## 2019-09-28 MED ORDER — GLYCOPYRROLATE PF 0.2 MG/ML IJ SOSY
PREFILLED_SYRINGE | INTRAMUSCULAR | Status: DC | PRN
  Administered 2019-09-28: .2 mg via INTRAVENOUS

## 2019-09-28 MED ORDER — CEFAZOLIN SODIUM-DEXTROSE 2-4 GM/100ML-% IV SOLN
INTRAVENOUS | Status: AC
  Filled 2019-09-28: qty 100

## 2019-09-28 MED ORDER — PHENYLEPHRINE HCL-NACL 20-0.9 MG/250ML-% IV SOLN
INTRAVENOUS | Status: DC | PRN
  Administered 2019-09-28: 20 ug/min via INTRAVENOUS

## 2019-09-28 SURGICAL SUPPLY — 32 items
AGX100 ×2 IMPLANT
BAG URINE DRAIN 2000ML AR STRL (UROLOGICAL SUPPLIES) ×2 IMPLANT
BLADE CLIPPER SENSICLIP SURGIC (BLADE) ×2 IMPLANT
CATH FOLEY 2WAY SLVR  5CC 16FR (CATHETERS) ×1
CATH FOLEY 2WAY SLVR 5CC 16FR (CATHETERS) ×1 IMPLANT
CATH ROBINSON RED A/P 16FR (CATHETERS) IMPLANT
CATH ROBINSON RED A/P 20FR (CATHETERS) ×2 IMPLANT
CLOTH BEACON ORANGE TIMEOUT ST (SAFETY) ×2 IMPLANT
CONT SPEC 4OZ CLIKSEAL STRL BL (MISCELLANEOUS) ×4 IMPLANT
COVER BACK TABLE 60X90IN (DRAPES) ×2 IMPLANT
COVER MAYO STAND STRL (DRAPES) ×2 IMPLANT
DRSG TEGADERM 4X4.75 (GAUZE/BANDAGES/DRESSINGS) ×2 IMPLANT
DRSG TEGADERM 8X12 (GAUZE/BANDAGES/DRESSINGS) ×4 IMPLANT
GAUZE SPONGE 4X4 12PLY STRL LF (GAUZE/BANDAGES/DRESSINGS) ×2 IMPLANT
GLOVE BIO SURGEON STRL SZ7.5 (GLOVE) IMPLANT
GLOVE BIO SURGEON STRL SZ8 (GLOVE) ×4 IMPLANT
GLOVE SURG ORTHO 8.5 STRL (GLOVE) ×2 IMPLANT
GLOVE SURG SS PI 6.5 STRL IVOR (GLOVE) IMPLANT
GOWN STRL REUS W/TWL XL LVL3 (GOWN DISPOSABLE) ×2 IMPLANT
HOLDER FOLEY CATH W/STRAP (MISCELLANEOUS) ×2 IMPLANT
IMPL SPACEOAR SYSTEM 10ML (Spacer) ×1 IMPLANT
IMPLANT SPACEOAR SYSTEM 10ML (Spacer) ×2 IMPLANT
IV NS 1000ML (IV SOLUTION) ×1
IV NS 1000ML BAXH (IV SOLUTION) ×1 IMPLANT
KIT TURNOVER CYSTO (KITS) ×2 IMPLANT
MARKER SKIN DUAL TIP RULER LAB (MISCELLANEOUS) ×2 IMPLANT
PACK CYSTO (CUSTOM PROCEDURE TRAY) ×2 IMPLANT
SUT BONE WAX W31G (SUTURE) IMPLANT
SYR 10ML LL (SYRINGE) ×2 IMPLANT
TOWEL OR 17X26 10 PK STRL BLUE (TOWEL DISPOSABLE) ×2 IMPLANT
UNDERPAD 30X30 (UNDERPADS AND DIAPERS) ×4 IMPLANT
WATER STERILE IRR 500ML POUR (IV SOLUTION) ×2 IMPLANT

## 2019-09-28 NOTE — Anesthesia Postprocedure Evaluation (Signed)
Anesthesia Post Note  Patient: Alan Hodges  Procedure(s) Performed: RADIOACTIVE SEED IMPLANT/BRACHYTHERAPY IMPLANT (N/A ) SPACE OAR INSTILLATION (N/A )     Patient location during evaluation: PACU Anesthesia Type: General Level of consciousness: awake and alert Pain management: pain level controlled Vital Signs Assessment: post-procedure vital signs reviewed and stable Respiratory status: spontaneous breathing, nonlabored ventilation, respiratory function stable and patient connected to nasal cannula oxygen Cardiovascular status: blood pressure returned to baseline and stable Postop Assessment: no apparent nausea or vomiting Anesthetic complications: no    Last Vitals:  Vitals:   09/28/19 1530 09/28/19 1550  BP: 107/75 106/71  Pulse: 61 70  Resp: 16 16  Temp:    SpO2: 95% 97%    Last Pain:  Vitals:   09/28/19 1550  TempSrc:   PainSc: 0-No pain                 Glynna Failla P Donnamaria Shands

## 2019-09-28 NOTE — Anesthesia Procedure Notes (Signed)
Procedure Name: LMA Insertion Date/Time: 09/28/2019 1:56 PM Performed by: Suan Halter, CRNA Pre-anesthesia Checklist: Patient identified, Emergency Drugs available, Suction available and Patient being monitored Patient Re-evaluated:Patient Re-evaluated prior to induction Oxygen Delivery Method: Circle system utilized Preoxygenation: Pre-oxygenation with 100% oxygen Induction Type: IV induction Ventilation: Mask ventilation without difficulty LMA: LMA inserted LMA Size: 4.0 Number of attempts: 1 Airway Equipment and Method: Bite block Placement Confirmation: positive ETCO2 Tube secured with: Tape Dental Injury: Teeth and Oropharynx as per pre-operative assessment

## 2019-09-28 NOTE — Transfer of Care (Signed)
Immediate Anesthesia Transfer of Care Note  Patient: Alan Hodges  Procedure(s) Performed: Procedure(s) (LRB): RADIOACTIVE SEED IMPLANT/BRACHYTHERAPY IMPLANT (N/A) SPACE OAR INSTILLATION (N/A)  Patient Location: PACU  Anesthesia Type: General  Level of Consciousness: awake, oriented, sedated and patient cooperative  Airway & Oxygen Therapy: Patient Spontanous Breathing and Patient connected to face mask oxygen  Post-op Assessment: Report given to PACU RN and Post -op Vital signs reviewed and stable  Post vital signs: Reviewed and stable  Complications: No apparent anesthesia complications  Last Vitals:  Vitals Value Taken Time  BP 134/101 09/28/19 1515  Temp    Pulse 71 09/28/19 1518  Resp 14 09/28/19 1518  SpO2 97 % 09/28/19 1518  Vitals shown include unvalidated device data.  Last Pain:  Vitals:   09/28/19 1227  TempSrc: Oral  PainSc: 1       Patients Stated Pain Goal: 3 (09/28/19 1227)

## 2019-09-28 NOTE — Anesthesia Preprocedure Evaluation (Signed)
Anesthesia Evaluation  Patient identified by MRN, date of birth, ID band Patient awake    Reviewed: Allergy & Precautions, NPO status , Patient's Chart, lab work & pertinent test results  Airway Mallampati: II  TM Distance: >3 FB Neck ROM: Full    Dental no notable dental hx.    Pulmonary neg pulmonary ROS,    Pulmonary exam normal breath sounds clear to auscultation       Cardiovascular hypertension, Pt. on medications Normal cardiovascular exam Rhythm:Regular Rate:Normal     Neuro/Psych negative neurological ROS  negative psych ROS   GI/Hepatic negative GI ROS, Neg liver ROS,   Endo/Other  negative endocrine ROS  Renal/GU negative Renal ROS  negative genitourinary   Musculoskeletal negative musculoskeletal ROS (+)   Abdominal   Peds negative pediatric ROS (+)  Hematology negative hematology ROS (+)   Anesthesia Other Findings   Reproductive/Obstetrics negative OB ROS                             Anesthesia Physical Anesthesia Plan  ASA: II  Anesthesia Plan: General   Post-op Pain Management:    Induction: Intravenous  PONV Risk Score and Plan: 2 and Ondansetron, Dexamethasone and Treatment may vary due to age or medical condition  Airway Management Planned: Oral ETT  Additional Equipment:   Intra-op Plan:   Post-operative Plan: Extubation in OR  Informed Consent: I have reviewed the patients History and Physical, chart, labs and discussed the procedure including the risks, benefits and alternatives for the proposed anesthesia with the patient or authorized representative who has indicated his/her understanding and acceptance.     Dental advisory given  Plan Discussed with: CRNA and Surgeon  Anesthesia Plan Comments:         Anesthesia Quick Evaluation  

## 2019-09-28 NOTE — Discharge Instructions (Signed)
Radioactive Seed Implant Home Care Instructions ° ° °Activity:    Rest for the remainder of the day.  Do not drive or operate equipment today.  You may resume normal  activities in a few days as instructed by your physician, without risk of harmful radiation exposure to those around you, provided you follow the time and distance precautions on the Radiation Oncology Instruction Sheet. ° ° °Meals: Drink plenty of lipuids and eat light foods, such as gelatin or soup this evening .  You may return to normal meal plan tomorrow. ° °Return °To Work: You may return to work as instructed by your physician. ° °Special °Instruction:   If any seeds are found, use tweezers to pick up seeds and place in a glass container of any kind and bring to your physician's office. ° °Call your physician if any of these symptoms occur: ° °· Persistent or heavy bleeding °· Urine stream diminishes or stops completely after catheter is removed °· Fever equal to or greater than 101 degrees F °· Cloudy urine with a strong foul odor °· Severe pain ° °You may feel some burning pain and/or hesitancy when you urinate after the catheter is removed.  These symptoms may increase over the next few weeks, but should diminish within forur to six weeks.  Applying moist heat to the lower abdomen or a hot tub bath may help relieve the pain.  If the discomfort becomes severe, please call your physician for additional medications. ° ° °Post Anesthesia Home Care Instructions ° °Activity: °Get plenty of rest for the remainder of the day. A responsible adult should stay with you for 24 hours following the procedure.  °For the next 24 hours, DO NOT: °-Drive a car °-Operate machinery °-Drink alcoholic beverages °-Take any medication unless instructed by your physician °-Make any legal decisions or sign important papers. ° °Meals: °Start with liquid foods such as gelatin or soup. Progress to regular foods as tolerated. Avoid greasy, spicy, heavy foods. If nausea  and/or vomiting occur, drink only clear liquids until the nausea and/or vomiting subsides. Call your physician if vomiting continues. ° °Special Instructions/Symptoms: °Your throat may feel dry or sore from the anesthesia or the breathing tube placed in your throat during surgery. If this causes discomfort, gargle with warm salt water. The discomfort should disappear within 24 hours. ° °If you had a scopolamine patch placed behind your ear for the management of post- operative nausea and/or vomiting: ° °1. The medication in the patch is effective for 72 hours, after which it should be removed.  Wrap patch in a tissue and discard in the trash. Wash hands thoroughly with soap and water. °2. You may remove the patch earlier than 72 hours if you experience unpleasant side effects which may include dry mouth, dizziness or visual disturbances. °3. Avoid touching the patch. Wash your hands with soap and water after contact with the patch. °  ° ° ° °

## 2019-09-28 NOTE — Op Note (Signed)
Preoperative diagnosis: Clinical stage TI C adenocarcinoma the prostate   Postoperative diagnosis: Same   Procedure: I-125 prostate seed implantation, placement of SpaceOAR,  flexible cystoscopy  Surgeon: Lillette Boxer. Michaila Kenney M.D.  Radiation Oncologist: Tyler Pita, M.D.  Anesthesia: Gen.   Indications: Patient  was diagnosed with clinical stage TI C prostate cancer. We had extensive discussion with him about treatment options versus. He elected to proceed with seed implantation. He underwent consultation my office as well as with Dr. Tammi Klippel. He appeared to understand the advantages disadvantages potential risks of this treatment option. Full informed consent has been obtained.   Technique and findings: Patient was brought the operating room where he had successful induction of general anesthesia. He was placed in dorso-lithotomy position and prepped and draped in usual manner. Appropriate surgical timeout was performed. Radiation oncology department placed a transrectal ultrasound probe anchoring stand. Foley catheter with contrast in the balloon was inserted without difficulty. Anchoring needles were placed within the prostate. Rectal tube was placed. Real-time contouring of the urethra prostate and rectum were performed and the dosing parameters were established. Targeted dose was 145 gray.  I was then called  to the operating suite suite for placement of the needles. A second timeout was performed. All needle passage was done with real-time transrectal ultrasound guidance with the sagittal plane. A total of 24 needles were placed.  71 active seeds were implanted.  I then proceeded with placement of SpaceOAR by introducing a needle with the bevel angled inferiorly approximately 2 cm superior to the anus. This was angled downward and under direct ultrasound was placed within the space between the prostatic capsule and rectum. This was confirmed with a small amount of sterile saline injected  and this was performed under direct ultrasound. I then attached the SpaceOAR to the needle and injected this in the space between the prostate and rectum with good placement noted. The Foley catheter was removed and flexible cystoscopy failed to show any seeds outside the prostate.  The patient was brought to recovery room in stable condition, having tolerated the procedure well.Marland Kitchen

## 2019-09-28 NOTE — H&P (Signed)
H&P  Chief Complaint: Prostate cancer  History of Present Illness: 74 year old healthy male presents at this time for I-125 brachytherapy as primary treatment modality for prostate cancer.  His urologic history is found below:  He underwent ultrasound biopsy of prostate on 2.17.2016. At that time, PSA was 4.79. Prostatic volume was 27.84 cc. PSA density 0.17. 1/12 cores returned adenocarcinoma-50% of the core at the right base lateral revealed GS 3+3 adenocarcinoma.   Oncotype DX study was performed on his first biopsy. This revealed likelihood of favorable pathology 86%.  7.22.2016: MRI revealed a 7 x 6 x 5 mm focus of hypointensity with restricted diffusion in the lateral right base to mid gland. There was no evidence of capsular penetration, seminal vesicle involvement, neurovascular bundle involvement, pelvic adenopathy or bone metastasis.  He underwent surveillance ultrasound and biopsy of his prostate on 7.29.2016. At that time, prostate volume was 31.99 cc. 1/12 cores returned adenocarcinoma, again the right base lateral, 20% of that core returned GS 3+3   1.31.2020: Most recent PSA 4.54 (1.24.2020). This is his lowest value in the past 3.5 years. IPSS 3 QOL score 0.   8.28.2020: Prostate MRI. Volume 36.87 ml. PIRADS 4 lesion of rt peripheral basilar prostate 13x7x10 mm. Small left iliac lesion.   9.28.2020.: Here for fusion TRUS/Bx.   09/22/19: Patient with above noted hx. He underwent repeat prostate MRI on 05/05/2019, which showed a PIRADS-4 13 mm lesion of the right peripheral basilar prostate and a small left iliac lesion. His DRE remained normal.   The patient proceeded to MRI-fusion biopsy with on 06/05/2019. The prostate volume measured 33.84 cc. Out of 16 core biopsies, 7 were positive. The maximum Gleason score was 3+4, and this was seen in all 4 cores from the ROI MRI lesion, right base, right mid (two small foci), and right base lateral.   He underwent bone scan on 06/19/2019,  which showed: indeterminate areas of increased activity within the thoracic and lumbar spine.   He proceeded to thoracic and lumbar spine MRIs on 07/14/2019. Both scans showed no aggressive osseous lesion to suggest metastatic disease, findings seen on recent bone scan likely reflect degenerative changes.     Past Medical History:  Diagnosis Date  . 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 Arthritis   . Chicken pox   . Complication of anesthesia 20008   low blood sugar after cataract surgery  . DDD (degenerative disc disease)   . DJD (degenerative joint disease)   . Hypertension    controlled with lisinopril and amlodipine   . Prostate cancer ([place])   . Rupture of subscapularis tendon, irreparable 07/01/2012  . Shoulder impingement syndrome, left 07/01/2012    Past Surgical History:  Procedure Laterality Date  . [place]   right  . cataracts Bilateral   . CERVICAL FUSION  1991  . COLONOSCOPY     x2  . KNEE ARTHROSCOPY  [id number]   right kneex3  . lefr arthroscopy     open x 2  . PROSTATE BIOPSY     x 2  . SHOULDER SURGERY Left 2008    Home Medications:  Allergies as of 09/28/2019   No Known Allergies     Medication List    Notice   Cannot display discharge medications because the patient has not yet been admitted.     Allergies: No Known Allergies  Family History  Problem Relation Age of Onset  . Hypertension Mother   . Diabetes Father   .  Cancer Neg Hx   . Breast cancer Neg Hx   . Prostate cancer Neg Hx   . Colon cancer Neg Hx     Social History:  reports that he has never smoked. He has never used smokeless tobacco. He reports current alcohol use. He reports that he does not use drugs.  ROS: A complete review of systems was performed.  All systems are negative except for pertinent findings as noted.  Physical Exam:  Vital signs in last 24 hours: Ht 6' (1.829 m)   Wt 82.6 kg   BMI 24.68 kg/m  Constitutional:  Alert and  oriented, No acute distress Cardiovascular: Regular rate  Respiratory: Normal respiratory effort GI: Abdomen is soft, nontender, nondistended, no abdominal masses. No CVAT.  Genitourinary: Normal male phallus, testes are descended bilaterally and non-tender and without masses, scrotum is normal in appearance without lesions or masses, perineum is normal on inspection. Lymphatic: No lymphadenopathy Neurologic: Grossly intact, no focal deficits Psychiatric: Normal mood and affect  Laboratory Data:  Recent Labs    09/25/19 1304  WBC 4.5  HGB 15.4  HCT 45.9  PLT 266    Recent Labs    09/25/19 1304  NA 138  K 4.6  CL 106  GLUCOSE 105*  BUN 24*  CALCIUM 8.8*  CREATININE 0.82     No results found for this or any previous visit (from the past 24 hour(s)). Recent Results (from the past 240 hour(s))  Novel Coronavirus, NAA (Hosp order, Send-out to Ref Lab; TAT 18-24 hrs     Status: None   Collection Time: 09/25/19 12:34 PM   Specimen: Nasopharyngeal Swab; Respiratory  Result Value Ref Range Status   SARS-CoV-2, NAA NOT DETECTED NOT DETECTED Final    Comment: (NOTE) This nucleic acid amplification test was developed and its performance characteristics determined by [employer]. Nucleic acid amplification tests include PCR and TMA. This test has not been FDA cleared or approved. This test has been authorized by FDA under an Emergency Use Authorization (EUA). This test is only authorized for the duration of time the declaration that circumstances exist justifying the authorization of the emergency use of in vitro diagnostic tests for detection of SARS-CoV-2 virus and/or diagnosis of COVID-19 infection under section 564(b)(1) of the Act, 21 U.S.C. [id number]) (1), unless the authorization is terminated or revoked sooner. When diagnostic testing is negative, the possibility of a false negative result should be considered in the context of a patient's recent exposures  and the presence of clinical signs and symptoms consistent with COVID-19. An individual without symptoms of COVID- 19 and who is not shedding SARS-CoV-2 vi rus would expect to have a negative (not detected) result in this assay. Performed At: [facility] [address] [place], [place] [id number] Rush Farmer MD [id number]    [place]  Final    Comment: Performed at [facility], [place] [address] [place], [place] [address]    Renal Function: Recent Labs    09/25/19 1304  CREATININE 0.82   Estimated Creatinine Clearance: 90.7 mL/min (by C-G formula based on SCr of 0.82 mg/dL).  Radiologic Imaging: No results found.  Impression/Assessment:  Adenocarcinoma the prostate  Plan:  I-125 brachytherapy

## 2019-09-28 NOTE — Interval H&P Note (Signed)
History and Physical Interval Note:  09/28/2019 1:40 PM  Alan Hodges  has presented today for surgery, with the diagnosis of PROSTATE CANCER.  The various methods of treatment have been discussed with the patient and family. After consideration of risks, benefits and other options for treatment, the patient has consented to  Procedure(s) with comments: RADIOACTIVE SEED IMPLANT/BRACHYTHERAPY IMPLANT (N/A) - 90 MINS SPACE OAR INSTILLATION (N/A) as a surgical intervention.  The patient's history has been reviewed, patient examined, no change in status, stable for surgery.  I have reviewed the patient's chart and labs.  Questions were answered to the patient's satisfaction.     Lillette Boxer Maliah Pyles

## 2019-09-30 NOTE — Progress Notes (Signed)
  Radiation Oncology         ([phone]) [phone] ________________________________  Name: Alan Hodges MRN: [id number]  Date: 09/30/2019  DOB: 1945/10/27       Prostate Seed Implant  [id number], [name], MD  No ref. provider found  DIAGNOSIS: 74 y.o. gentleman with Stage T1c adenocarcinoma of the prostate with Gleason score of 3+4, and PSA of 4.34 .    ICD-10-CM   1. Preop testing  Z01.818 DG Chest 2 View    DG Chest 2 View    PROCEDURE: Insertion of radioactive I-125 seeds into the prostate gland.  RADIATION DOSE: 145 Gy, definitive/boost therapy.  TECHNIQUE: [name] was brought to the operating room with the urologist. He was placed in the dorsolithotomy position. He was catheterized and a rectal tube was inserted. The perineum was shaved, prepped and draped. The ultrasound probe was then introduced into the rectum to see the prostate gland.  TREATMENT DEVICE: A needle grid was attached to the ultrasound probe stand and anchor needles were placed.  3D PLANNING: The prostate was imaged in 3D using a sagittal sweep of the prostate probe. These images were transferred to the planning computer. There, the prostate, urethra and rectum were defined on each axial reconstructed image. Then, the software created an optimized 3D plan and a few seed positions were adjusted. The quality of the plan was reviewed using [facility] information for the target and the following two organs at risk:  Urethra and Rectum.  Then the accepted plan was printed and handed off to the radiation therapist.  Under my supervision, the custom loading of the seeds and spacers was carried out and loaded into sealed vicryl sleeves.  These pre-loaded needles were then placed into the needle holder.Marland Kitchen  PROSTATE VOLUME STUDY:  Using transrectal ultrasound the volume of the prostate was verified to be 39.4 cc.  SPECIAL TREATMENT PROCEDURE/SUPERVISION AND HANDLING: The pre-loaded needles were then delivered under sagittal  guidance. A total of 24 needles were used to deposit 71 seeds in the prostate gland. The individual seed activity was 0.414 mCi.  SpaceOAR:  Yes  COMPLEX SIMULATION: At the end of the procedure, an anterior radiograph of the pelvis was obtained to document seed positioning and count. Cystoscopy was performed to check the urethra and bladder.  MICRODOSIMETRY: At the end of the procedure, the patient was emitting 0.130 mR/hr at 1 meter. Accordingly, he was considered safe for hospital discharge.  PLAN: The patient will return to the radiation oncology clinic for post implant CT dosimetry in three weeks.   ________________________________  Sheral Apley Tammi Klippel, M.D.

## 2019-10-11 ENCOUNTER — Telehealth: Payer: Self-pay | Admitting: *Deleted

## 2019-10-11 NOTE — Telephone Encounter (Signed)
Called patient to remnd of post seed appts. and MRI for 10-12-19, spoke with patient and he is aware of these appts.

## 2019-10-12 ENCOUNTER — Encounter: Payer: Self-pay | Admitting: Urology

## 2019-10-12 ENCOUNTER — Encounter: Payer: Self-pay | Admitting: Medical Oncology

## 2019-10-12 ENCOUNTER — Other Ambulatory Visit: Payer: Self-pay

## 2019-10-12 ENCOUNTER — Ambulatory Visit
Admission: RE | Admit: 2019-10-12 | Discharge: 2019-10-12 | Disposition: A | Discharge status: Home / Self Care | Payer: Medicare Other | Source: Ambulatory Visit | Attending: Radiation Oncology | Admitting: Radiation Oncology

## 2019-10-12 ENCOUNTER — Ambulatory Visit (HOSPITAL_COMMUNITY)
Admission: RE | Admit: 2019-10-12 | Discharge: 2019-10-12 | Disposition: A | Discharge status: Home / Self Care | Payer: Medicare Other | Source: Ambulatory Visit | Attending: Urology | Admitting: Urology

## 2019-10-12 ENCOUNTER — Ambulatory Visit
Admission: RE | Admit: 2019-10-12 | Discharge: 2019-10-12 | Disposition: A | Discharge status: Home / Self Care | Payer: Medicare Other | Source: Ambulatory Visit | Attending: Urology | Admitting: Urology

## 2019-10-12 VITALS — BP 166/107 | HR 61 | Temp 98.0°F | Resp 20

## 2019-10-12 DIAGNOSIS — Z79899 Other long term (current) drug therapy: Secondary | ICD-10-CM | POA: Diagnosis not present

## 2019-10-12 DIAGNOSIS — R350 Frequency of micturition: Secondary | ICD-10-CM | POA: Insufficient documentation

## 2019-10-12 DIAGNOSIS — R3911 Hesitancy of micturition: Secondary | ICD-10-CM | POA: Insufficient documentation

## 2019-10-12 DIAGNOSIS — C61 Malignant neoplasm of prostate: Secondary | ICD-10-CM | POA: Insufficient documentation

## 2019-10-12 DIAGNOSIS — Z923 Personal history of irradiation: Secondary | ICD-10-CM | POA: Diagnosis not present

## 2019-10-12 NOTE — Progress Notes (Signed)
Alan Hodges is here for a Post Seed follow up appointment staff checked patient blood pressure with a reading of 166/107 patient denies any pain, discomfort, dizziness, chest pain, numbness or tingling to extremities. Has a MRI scheduled at 4pm. Follow up with urologist on 10/20/2019. Reports denies any dysuria, hematuria, nocturia 1-2 times a night. Weakened urine stream with no pain empties bladder with urination. Patient denies any issue with bowels.    Today's Vitals   10/12/19 1403  BP: (!) 166/107  Pulse: 61  Resp: 20  Temp: 98 F (36.7 C)  SpO2: 99%   There is no height or weight on file to calculate BMI.

## 2019-10-12 NOTE — Progress Notes (Signed)
Radiation Oncology         [phone]) [phone] ________________________________  Name: Sylvia Stanberry MRN: [id number]  Date: 10/12/2019  DOB: [birth year]  Post-Seed Follow-Up Visit Note  CC: [name], MD  [name], MD  Diagnosis:   74 y.o. gentleman with Stage T1c adenocarcinoma of the prostate with Gleason score of 3+4, and PSA of 4.34 .    ICD-10-CM   1. Prostate cancer ([place])  C61     Interval Since Last Radiation:  2 weeks 09/28/19:  Insertion of radioactive I-125 seeds into the prostate gland; 145 Gy, definitive/boost therapy with placement of SpaceOAR gel.  Narrative:  The patient returns today for routine follow-up.  He is complaining of increased urinary frequency and urinary hesitation symptoms. He filled out a questionnaire regarding urinary function today providing and overall IPSS score of 7 characterizing his symptoms as mild with nocturia x2.  His pre-implant score was 1. He denies any abdominal pain, N/V or bowel symptoms.  He reports a healthy appetite and normal energy level.  Overall, he is quite pleased with his progress to date.  ALLERGIES:  has No Known Allergies.  Meds: Current Outpatient Medications  Medication Sig Dispense Refill  . amLODipine (NORVASC) 5 MG tablet Take 1 tablet (5 mg total) by mouth daily. 90 tablet 3  . lisinopril (ZESTRIL) 10 MG tablet Take 1 tablet (10 mg total) by mouth daily. 90 tablet 3  . Melatonin 3 MG TABS Take by mouth at bedtime as needed.     Marland Kitchen ibuprofen (ADVIL,MOTRIN) 200 MG tablet Take 200 mg by mouth every 8 (eight) hours as needed (just as needed).    . Naproxen Sodium (ALEVE) 220 MG CAPS Take by mouth.     No current facility-administered medications for this encounter.    Physical Findings: In general this is a well appearing Caucasian male in no acute distress. He's alert and oriented x4 and appropriate throughout the examination. Cardiopulmonary assessment is negative for acute distress and he exhibits normal  effort.   Lab Findings: Lab Results  Component Value Date   WBC 4.5 09/25/2019   HGB 15.4 09/25/2019   HCT 45.9 09/25/2019   MCV 94.1 09/25/2019   PLT 266 09/25/2019    Radiographic Findings:  Patient underwent CT imaging in our clinic for post implant dosimetry. The CT will be reviewed by Dr. Tammi Klippel to confirm there is an adequate distribution of radioactive seeds throughout the prostate gland and ensure that there are no seeds in or near the rectum. His scheduled for prostate MRI at 4pm today and those images will be fused with his CT images for further evaluation. We suspect the final radiation plan and dosimetry will show appropriate coverage of the prostate gland. He understands that we will call and inform him of any unexpected findings on further review of his imaging and dosimetry.  Impression/Plan: 74 y.o. gentleman with Stage T1c adenocarcinoma of the prostate with Gleason score of 3+4, and PSA of 4.34 .  The patient is recovering from the effects of radiation. His urinary symptoms should gradually improve over the next 4-6 months. We talked about this today. He is encouraged by his improvement already and is otherwise pleased with his outcome. We also talked about long-term follow-up for prostate cancer following seed implant. He understands that ongoing PSA determinations and digital rectal exams will help perform surveillance to rule out disease recurrence. He has a follow up appointment scheduled with Dr. Diona Fanti on 10/20/19. He understands what to  expect with his PSA measures. Patient was also educated today about some of the long-term effects from radiation including a small risk for rectal bleeding and possibly erectile dysfunction. We talked about some of the general management approaches to these potential complications. However, I did encourage the patient to contact our office or return at any point if he has questions or concerns related to his previous radiation and  prostate cancer.    Nicholos Johns, PA-C

## 2019-10-12 NOTE — Progress Notes (Signed)
Alan Hodges states he is doing well post seed implant. He states his main complaint is urinary symptoms but they are improving. We discussed how they will continue to improve over the next 4-6 months. Stressed the importance of follow up with his urologist and PSA labs. Asked him to call me if I can assist him in any way. He voiced understanding and appreciation for his care.

## 2019-10-14 NOTE — Progress Notes (Deleted)
  Radiation Oncology         [phone]) [phone] ________________________________  Name: Alan Hodges MRN: [id number]  Date: 10/12/2019  DOB: 10-May-1946  COMPLEX SIMULATION NOTE  NARRATIVE:  The patient was brought to the [facility] today following prostate seed implantation approximately one month ago.  Identity was confirmed.  All relevant records and images related to the planned course of therapy were reviewed.  Then, the patient was set-up supine.  CT images were obtained.  The CT images were loaded into the planning software.  Then the prostate and rectum were contoured.  Treatment planning then occurred.  The implanted iodine 125 seeds were identified by the physics staff for projection of radiation distribution  I have requested : 3D Simulation  I have requested a DVH of the following structures: Prostate and rectum.    ________________________________  Sheral Apley Tammi Klippel, M.D.

## 2019-10-14 NOTE — Progress Notes (Signed)
  Radiation Oncology         [phone]) [phone] ________________________________  Name: Alan Hodges MRN: [id number]  Date: 10/12/2019  DOB: Jul 23, 1946  COMPLEX SIMULATION NOTE  NARRATIVE:  The patient was brought to the [facility] today following prostate seed implantation approximately one month ago.  Identity was confirmed.  All relevant records and images related to the planned course of therapy were reviewed.  Then, the patient was set-up supine.  CT images were obtained.  The CT images were loaded into the planning software.  Then the prostate and rectum were contoured.  Treatment planning then occurred.  The implanted iodine 125 seeds were identified by the physics staff for projection of radiation distribution  I have requested : 3D Simulation  I have requested a DVH of the following structures: Prostate and rectum.    ________________________________  Sheral Apley Tammi Klippel, M.D.

## 2019-10-20 DIAGNOSIS — C61 Malignant neoplasm of prostate: Secondary | ICD-10-CM | POA: Diagnosis not present

## 2019-10-26 ENCOUNTER — Telehealth: Payer: Self-pay | Admitting: Family Medicine

## 2019-10-26 NOTE — Telephone Encounter (Signed)
Left message for patient to call back and schedule Medicare Annual Wellness Visit (AWV) either virtually,audio only or in person (whichever the patient prefers--45 MINUTES).  Last AWV 11.28.17; please schedule at anytime with [facility]-Nurse Health Advisor at [facility] at [place].

## 2019-11-15 ENCOUNTER — Encounter: Payer: Self-pay | Admitting: Radiation Oncology

## 2019-11-15 ENCOUNTER — Ambulatory Visit
Admission: RE | Admit: 2019-11-15 | Discharge: 2019-11-15 | Disposition: A | Discharge status: Home / Self Care | Payer: Medicare Other | Source: Ambulatory Visit | Attending: Radiation Oncology | Admitting: Radiation Oncology

## 2019-11-15 DIAGNOSIS — C61 Malignant neoplasm of prostate: Secondary | ICD-10-CM | POA: Diagnosis not present

## 2019-11-21 NOTE — Progress Notes (Signed)
  Radiation Oncology         [phone]) [phone] ________________________________  Name: Givanni Behringer MRN: [id number]  Date: 11/15/2019  DOB: 02-Sep-1946  3D Planning Note   Prostate Brachytherapy Post-Implant Dosimetry  Diagnosis:  74 y.o. gentleman with Stage T1c adenocarcinoma of the prostate with Gleason score of 3+4, and PSA of 4.34  Narrative: On a previous date, Alan Hodges returned following prostate seed implantation for post implant planning. He underwent CT scan complex simulation to delineate the three-dimensional structures of the pelvis and demonstrate the radiation distribution.  Since that time, the seed localization, and complex isodose planning with dose volume histograms have now been completed.  Results:   Prostate Coverage - The dose of radiation delivered to the 90% or more of the prostate gland (D90) was 106.84% of the prescription dose. This exceeds our goal of greater than 90%. Rectal Sparing - The volume of rectal tissue receiving the prescription dose or higher was 0.0 cc. This falls under our thresholds tolerance of 1.0 cc.  Impression: The prostate seed implant appears to show adequate target coverage and appropriate rectal sparing.  Plan:  The patient will continue to follow with urology for ongoing PSA determinations. I would anticipate a high likelihood for local tumor control with minimal risk for rectal morbidity.  ________________________________  Sheral Apley Tammi Klippel, M.D.

## 2019-12-01 ENCOUNTER — Encounter: Payer: Self-pay | Admitting: Family Medicine

## 2020-01-26 DIAGNOSIS — N5201 Erectile dysfunction due to arterial insufficiency: Secondary | ICD-10-CM | POA: Diagnosis not present

## 2020-01-26 DIAGNOSIS — C61 Malignant neoplasm of prostate: Secondary | ICD-10-CM | POA: Diagnosis not present

## 2020-05-08 DIAGNOSIS — C61 Malignant neoplasm of prostate: Secondary | ICD-10-CM | POA: Diagnosis not present

## 2020-05-08 LAB — PSA

## 2020-05-29 ENCOUNTER — Ambulatory Visit (INDEPENDENT_AMBULATORY_CARE_PROVIDER_SITE_OTHER): Payer: Medicare Other | Admitting: Family Medicine

## 2020-05-29 ENCOUNTER — Other Ambulatory Visit: Payer: Self-pay

## 2020-05-29 VITALS — BP 140/90 | HR 64 | Temp 97.7°F | Wt 181.4 lb

## 2020-05-29 DIAGNOSIS — N400 Enlarged prostate without lower urinary tract symptoms: Secondary | ICD-10-CM | POA: Diagnosis not present

## 2020-05-29 DIAGNOSIS — C61 Malignant neoplasm of prostate: Secondary | ICD-10-CM | POA: Diagnosis not present

## 2020-05-29 DIAGNOSIS — Z23 Encounter for immunization: Secondary | ICD-10-CM

## 2020-05-29 DIAGNOSIS — N5201 Erectile dysfunction due to arterial insufficiency: Secondary | ICD-10-CM | POA: Diagnosis not present

## 2020-05-29 DIAGNOSIS — Z0000 Encounter for general adult medical examination without abnormal findings: Secondary | ICD-10-CM | POA: Diagnosis not present

## 2020-05-29 NOTE — Patient Instructions (Signed)
Monitor blood pressure and be in touch if consistently > 140/90.   

## 2020-05-29 NOTE — Progress Notes (Signed)
Established Patient Office Visit  Subjective:  Patient ID: Alan Hodges, male    DOB: [birth year]  Age: 74 y.o. MRN: [id number]  CC: No chief complaint on file.   HPI Alan Hodges presents for physical exam.  He has history of hypertension, osteoarthritis, prostate cancer.  He underwent surgery for radiation seed implant last January.  He did okay with that without any complications.  He has follow-up with urology later today.  Plans to get repeat PSA with him.  He does need repeat colonoscopy soon.  Generally feels well.  [place] outside [place].  He stays very busy with maintaining his property.  Recently bought some additional land and now has 92 acres.  Stays very physically active.  As hypertension treated with lisinopril and amlodipine.  Blood pressure generally well controlled by home readings.  Maintenance reviewed  -Covid vaccines already given -Previous hepatitis C screen negative -Pneumonia vaccines complete -Tetanus due 2028 -Needs flu vaccine and he consents to getting that today -Overdue for follow-up colonoscopy.  He has found someone in [place] that he plans to get that with soon -Had previous Zostavax 2014  Past Medical History:  Diagnosis Date  . 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 Arthritis   . Chicken pox   . Complication of anesthesia 20008   low blood sugar after cataract surgery  . DDD (degenerative disc disease)   . DJD (degenerative joint disease)   . Hypertension    controlled with lisinopril and amlodipine   . Prostate cancer ([place])   . Rupture of subscapularis tendon, irreparable 07/01/2012  . Shoulder impingement syndrome, left 07/01/2012    Past Surgical History:  Procedure Laterality Date  . [place]   right  . cataracts Bilateral   . CERVICAL FUSION  1991  . COLONOSCOPY     x2  . KNEE ARTHROSCOPY  35,57,32   right kneex3  . lefr arthroscopy     open x 2  .  PROSTATE BIOPSY     x 2  . RADIOACTIVE SEED IMPLANT N/A 09/28/2019   Procedure: RADIOACTIVE SEED IMPLANT/BRACHYTHERAPY IMPLANT;  Surgeon: Franchot Gallo, MD;  Location: [facility];  Service: Urology;  Laterality: N/A;  90 MINS  . SHOULDER SURGERY Left 2008  . SPACE OAR INSTILLATION N/A 09/28/2019   Procedure: SPACE OAR INSTILLATION;  Surgeon: Franchot Gallo, MD;  Location: [facility];  Service: Urology;  Laterality: N/A;    Family History  Problem Relation Age of Onset  . Hypertension Mother   . Diabetes Father   . Cancer Neg Hx   . Breast cancer Neg Hx   . Prostate cancer Neg Hx   . Colon cancer Neg Hx     Social History   Socioeconomic History  . Marital status: Single    Spouse name: Not on file  . Number of children: Not on file  . Years of education: Not on file  . Highest education level: Not on file  Occupational History  . Occupation: retired in 2014  Tobacco Use  . Smoking status: Never Smoker  . Smokeless tobacco: Never Used  Vaping Use  . Vaping Use: Never used  Substance and Sexual Activity  . Alcohol use: Yes    Comment: daily 3 beers night  . Drug use: No  . Sexual activity: Not Currently  Other Topics Concern  . Not on file  Social History Narrative  . Not on file  Social Determinants of Health   Financial Resource Strain:   . Difficulty of Paying Living Expenses: Not on file  Food Insecurity:   . Worried About Charity fundraiser in the Last Year: Not on file  . Ran Out of Food in the Last Year: Not on file  Transportation Needs:   . Lack of Transportation (Medical): Not on file  . Lack of Transportation (Non-Medical): Not on file  Physical Activity:   . Days of Exercise per Week: Not on file  . Minutes of Exercise per Session: Not on file  Stress:   . Feeling of Stress : Not on file  Social Connections:   . Frequency of Communication with Friends and Family: Not on file  . Frequency of Social  Gatherings with Friends and Family: Not on file  . Attends Religious Services: Not on file  . Active Member of Clubs or Organizations: Not on file  . Attends Archivist Meetings: Not on file  . Marital Status: Not on file  Intimate Partner Violence:   . Fear of Current or Ex-Partner: Not on file  . Emotionally Abused: Not on file  . Physically Abused: Not on file  . Sexually Abused: Not on file    Outpatient Medications Prior to Visit  Medication Sig Dispense Refill  . amLODipine (NORVASC) 5 MG tablet Take 1 tablet (5 mg total) by mouth daily. 90 tablet 3  . ibuprofen (ADVIL,MOTRIN) 200 MG tablet Take 200 mg by mouth every 8 (eight) hours as needed (just as needed).    Marland Kitchen lisinopril (ZESTRIL) 10 MG tablet Take 1 tablet (10 mg total) by mouth daily. 90 tablet 3  . Melatonin 3 MG TABS Take by mouth at bedtime as needed.     . Naproxen Sodium (ALEVE) 220 MG CAPS Take by mouth.     No facility-administered medications prior to visit.    No Known Allergies  ROS Review of Systems  Constitutional: Negative for appetite change, chills, fatigue, fever and unexpected weight change.  Eyes: Negative for visual disturbance.  Respiratory: Negative for cough, chest tightness and shortness of breath.   Cardiovascular: Negative for chest pain, palpitations and leg swelling.  Gastrointestinal: Negative for abdominal pain.  Genitourinary: Negative for dysuria.  Musculoskeletal: Positive for arthralgias.  Neurological: Negative for dizziness, syncope, weakness, light-headedness and headaches.      Objective:    Physical Exam Constitutional:      General: He is not in acute distress.    Appearance: He is well-developed. He is not toxic-appearing.  HENT:     Right Ear: External ear normal.     Left Ear: External ear normal.  Eyes:     Pupils: Pupils are equal, round, and reactive to light.  Neck:     Thyroid: No thyromegaly.  Cardiovascular:     Rate and Rhythm: Normal rate and  regular rhythm.  Pulmonary:     Effort: Pulmonary effort is normal. No respiratory distress.     Breath sounds: Normal breath sounds. No wheezing or rales.  Abdominal:     Palpations: Abdomen is soft. There is no mass.     Tenderness: There is no abdominal tenderness.  Musculoskeletal:     Cervical back: Neck supple.     Right lower leg: No edema.     Left lower leg: No edema.  Skin:    Findings: No rash.  Neurological:     Mental Status: He is alert and oriented to person, place, and time.  BP 140/90 (BP Location: Right Arm, Patient Position: Sitting)   Pulse 64   Temp 97.7 F (36.5 C)   Wt 181 lb 6.4 oz (82.3 kg)   SpO2 98%   BMI 24.60 kg/m  Wt Readings from Last 3 Encounters:  05/29/20 181 lb 6.4 oz (82.3 kg)  09/28/19 185 lb (83.9 kg)  07/21/19 181 lb (82.1 kg)     Health Maintenance Due  Topic Date Due  . COLONOSCOPY  11/07/2018  . INFLUENZA VACCINE  04/07/2020    There are no preventive care reminders to display for this patient.  Lab Results  Component Value Date   TSH 1.29 07/19/2019   Lab Results  Component Value Date   WBC 4.5 09/25/2019   HGB 15.4 09/25/2019   HCT 45.9 09/25/2019   MCV 94.1 09/25/2019   PLT 266 09/25/2019   Lab Results  Component Value Date   NA 138 09/25/2019   K 4.6 09/25/2019   CO2 25 09/25/2019   GLUCOSE 105 (H) 09/25/2019   BUN 24 (H) 09/25/2019   CREATININE 0.82 09/25/2019   BILITOT 0.8 09/25/2019   ALKPHOS 71 09/25/2019   AST 18 09/25/2019   ALT 20 09/25/2019   PROT 7.1 09/25/2019   ALBUMIN 4.1 09/25/2019   CALCIUM 8.8 (L) 09/25/2019   ANIONGAP 7 09/25/2019   GFR 80.02 07/19/2019   Lab Results  Component Value Date   CHOL 199 07/19/2019   Lab Results  Component Value Date   HDL 55.80 07/19/2019   Lab Results  Component Value Date   LDLCALC 115 (H) 07/19/2019   Lab Results  Component Value Date   TRIG 138.0 07/19/2019   Lab Results  Component Value Date   CHOLHDL 4 07/19/2019   No results  found for: HGBA1C    Assessment & Plan:   Problem List Items Addressed This Visit    None    Visit Diagnoses    Physical exam    -  Primary   Relevant Orders   Basic metabolic panel   Lipid panel   CBC with Differential/Platelet   TSH   Hepatic function panel    Patient has hypertension.  Controlled by home readings but up today.  We recommend he monitor this closely next couple weeks at home to be in touch if consistently greater than 140/90  -Flu vaccine given -Continue close follow-up with urology regarding his prostate cancer -Obtain screening lab work today.  Deferred PSA to urology -Patient plans to set up colonoscopy closer to home as above  No orders of the defined types were placed in this encounter.   Follow-up: No follow-ups on file.    Carolann Littler, MD

## 2020-05-29 NOTE — Addendum Note (Signed)
Addended by: Alverda Skeans on: 05/29/2020 12:53 PM   Modules accepted: Orders

## 2020-05-30 ENCOUNTER — Encounter: Payer: Self-pay | Admitting: Family Medicine

## 2020-05-30 LAB — BASIC METABOLIC PANEL
BUN: 18 mg/dL (ref 7–25)
CO2: 27 mmol/L (ref 20–32)
Calcium: 9.1 mg/dL (ref 8.6–10.3)
Chloride: 104 mmol/L (ref 98–110)
Creat: 0.91 mg/dL (ref 0.70–1.18)
Glucose, Bld: 103 mg/dL — ABNORMAL HIGH (ref 65–99)
Potassium: 3.8 mmol/L (ref 3.5–5.3)
Sodium: 139 mmol/L (ref 135–146)

## 2020-05-30 LAB — LIPID PANEL
Cholesterol: 183 mg/dL (ref <200)
HDL: 53 mg/dL (ref >=40)
LDL Cholesterol (Calc): 113 mg/dL (calc) — ABNORMAL HIGH
Non-HDL Cholesterol (Calc): 130 mg/dL (calc) — ABNORMAL HIGH (ref <130)
Total CHOL/HDL Ratio: 3.5 (calc) (ref <5.0)
Triglycerides: 77 mg/dL (ref <150)

## 2020-05-30 LAB — CBC WITH DIFFERENTIAL/PLATELET
Absolute Monocytes: 403 cells/uL (ref 200–950)
Basophils Absolute: 10 cells/uL (ref 0–200)
Basophils Relative: 0.2 %
Eosinophils Absolute: 72 cells/uL (ref 15–500)
Eosinophils Relative: 1.5 %
HCT: 44.9 % (ref 38.5–50.0)
Hemoglobin: 15.6 g/dL (ref 13.2–17.1)
Lymphs Abs: 1502 cells/uL (ref 850–3900)
MCH: 32.6 pg (ref 27.0–33.0)
MCHC: 34.7 g/dL (ref 32.0–36.0)
MCV: 93.9 fL (ref 80.0–100.0)
MPV: 10.7 fL (ref 7.5–12.5)
Monocytes Relative: 8.4 %
Neutro Abs: 2813 cells/uL (ref 1500–7800)
Neutrophils Relative %: 58.6 %
Platelets: 241 10*3/uL (ref 140–400)
RBC: 4.78 10*6/uL (ref 4.20–5.80)
RDW: 12 % (ref 11.0–15.0)
Total Lymphocyte: 31.3 %
WBC: 4.8 10*3/uL (ref 3.8–10.8)

## 2020-05-30 LAB — HEPATIC FUNCTION PANEL
AG Ratio: 1.6 (calc) (ref 1.0–2.5)
ALT: 16 U/L (ref 9–46)
AST: 15 U/L (ref 10–35)
Albumin: 4.1 g/dL (ref 3.6–5.1)
Alkaline phosphatase (APISO): 68 U/L (ref 35–144)
Bilirubin, Direct: 0.2 mg/dL (ref 0.0–0.2)
Globulin: 2.5 g/dL (calc) (ref 1.9–3.7)
Indirect Bilirubin: 0.7 mg/dL (calc) (ref 0.2–1.2)
Total Bilirubin: 0.9 mg/dL (ref 0.2–1.2)
Total Protein: 6.6 g/dL (ref 6.1–8.1)

## 2020-05-30 LAB — TSH: TSH: 1.11 mIU/L (ref 0.40–4.50)

## 2020-06-13 ENCOUNTER — Encounter: Payer: Self-pay | Admitting: Family Medicine

## 2020-07-02 ENCOUNTER — Encounter: Payer: Self-pay | Admitting: Family Medicine

## 2020-07-02 DIAGNOSIS — Z1211 Encounter for screening for malignant neoplasm of colon: Secondary | ICD-10-CM

## 2020-07-10 DIAGNOSIS — H5213 Myopia, bilateral: Secondary | ICD-10-CM | POA: Diagnosis not present

## 2020-08-01 ENCOUNTER — Other Ambulatory Visit: Payer: Self-pay | Admitting: Family Medicine

## 2020-08-12 NOTE — Progress Notes (Unsigned)
Subjective:   Alan Hodges is a 74 y.o. male who presents for Medicare Annual/Subsequent preventive examination.  Virtual Visit via Video Note  I connected with Alan Hodges on 08/13/20 at  9:00 AM EST by a video enabled telemedicine application and verified that I am speaking with the correct person using two identifiers.  Location: Patient: Home  Provider: Office    I discussed the limitations of evaluation and management by telemedicine and the availability of in person appointments. The patient expressed understanding and agreed to proceed.  Ofilia Neas, LPN   Review of Systems    N/A  Cardiac Risk Factors include: advanced age (>48men, >16 women);male gender;hypertension     Objective:    Today's Vitals   There is no height or weight on file to calculate BMI.  Advanced Directives 08/13/2020 10/12/2019 09/28/2019 07/21/2019 08/04/2016 07/01/2012 06/27/2012  Does Patient Have a Medical Advance Directive? No No No No Yes Patient has advance directive, copy not in chart Patient has advance directive, copy not in chart  Would patient like information on creating a medical advance directive? No - Patient declined - Yes (MAU/Ambulatory/Procedural Areas - Information given) Yes (MAU/Ambulatory/Procedural Areas - Information given) - - -    Current Medications (verified) Outpatient Encounter Medications as of 08/13/2020  Medication Sig  . amLODipine (NORVASC) 5 MG tablet TAKE 1 TABLET BY MOUTH EVERY DAY  . ibuprofen (ADVIL,MOTRIN) 200 MG tablet Take 200 mg by mouth every 8 (eight) hours as needed (just as needed).  Marland Kitchen lisinopril (ZESTRIL) 10 MG tablet TAKE 1 TABLET BY MOUTH EVERY DAY  . Melatonin 3 MG TABS Take by mouth at bedtime as needed.   . [DISCONTINUED] Naproxen Sodium (ALEVE) 220 MG CAPS Take by mouth.   No facility-administered encounter medications on file as of 08/13/2020.    Allergies (verified) Patient has no known allergies.   History: Past Medical  History:  Diagnosis Date  . 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 Arthritis   . Chicken pox   . Complication of anesthesia 20008   low blood sugar after cataract surgery  . DDD (degenerative disc disease)   . DJD (degenerative joint disease)   . Hypertension    controlled with lisinopril and amlodipine   . Prostate cancer ([place])   . Rupture of subscapularis tendon, irreparable 07/01/2012  . Shoulder impingement syndrome, left 07/01/2012   Past Surgical History:  Procedure Laterality Date  . [place]   right  . cataracts Bilateral   . CERVICAL FUSION  1991  . COLONOSCOPY     x2  . KNEE ARTHROSCOPY  16,10,96   right kneex3  . lefr arthroscopy     open x 2  . PROSTATE BIOPSY     x 2  . RADIOACTIVE SEED IMPLANT N/A 09/28/2019   Procedure: RADIOACTIVE SEED IMPLANT/BRACHYTHERAPY IMPLANT;  Surgeon: Franchot Gallo, MD;  Location[facility];  Service: Urology;  Laterality: N/A;  90 MINS  . SHOULDER SURGERY Left 2008  . SPACE OAR INSTILLATION N/A 09/28/2019   Procedure: SPACE OAR INSTILLATION;  Surgeon: Franchot Gallo, MD;  Location: [facility];  Service: Urology;  Laterality: N/A;   Family History  Problem Relation Age of Onset  . Hypertension Mother   . Diabetes Father   . Cancer Neg Hx   . Breast cancer Neg Hx   . Prostate cancer Neg Hx   . Colon cancer Neg Hx    Social History   Socioeconomic History  .  Marital status: Single    Spouse name: Not on file  . Number of children: Not on file  . Years of education: Not on file  . Highest education level: Not on file  Occupational History  . Occupation: retired in 2014  Tobacco Use  . Smoking status: Never Smoker  . Smokeless tobacco: Never Used  Vaping Use  . Vaping Use: Never used  Substance and Sexual Activity  . Alcohol use: Yes    Comment: daily 3 beers night  . Drug use: No  . Sexual activity: Not Currently  Other Topics Concern  . Not on file   Social History Narrative  . Not on file   Social Determinants of Health   Financial Resource Strain: Low Risk   . Difficulty of Paying Living Expenses: Not hard at all  Food Insecurity: No Food Insecurity  . Worried About Charity fundraiser in the Last Year: Never true  . Ran Out of Food in the Last Year: Never true  Transportation Needs: No Transportation Needs  . Lack of Transportation (Medical): No  . Lack of Transportation (Non-Medical): No  Physical Activity: Inactive  . Days of Exercise per Week: 0 days  . Minutes of Exercise per Session: 0 min  Stress: No Stress Concern Present  . Feeling of Stress : Not at all  Social Connections: Socially Isolated  . Frequency of Communication with Friends and Family: Once a week  . Frequency of Social Gatherings with Friends and Family: Once a week  . Attends Religious Services: Never  . Active Member of Clubs or Organizations: No  . Attends Archivist Meetings: Never  . Marital Status: Living with partner    Tobacco Counseling Counseling given: Not Answered   Clinical Intake:  Pre-visit preparation completed: Yes  Pain : No/denies pain     Nutritional Risks: None Diabetes: No CBG done?: No Did pt. bring in CBG monitor from home?: No  How often do you need to have someone help you when you read instructions, pamphlets, or other written materials from your doctor or pharmacy?: 1 - Never What is the last grade level you completed in school?: College  Diabetic?No   Interpreter Needed?: No  Information entered by :: [place] of Daily Living In your present state of health, do you have any difficulty performing the following activities: 08/13/2020 09/28/2019  Hearing? N N  Vision? N N  Difficulty concentrating or making decisions? N N  Walking or climbing stairs? N Y  Dressing or bathing? N N  Doing errands, shopping? N -  Preparing Food and eating ? N -  Using the Toilet? N -  In the past  six months, have you accidently leaked urine? N -  Do you have problems with loss of bowel control? N -  Managing your Medications? N -  Managing your Finances? N -  Housekeeping or managing your Housekeeping? N -  Some recent data might be hidden    Patient Care Team: [name], MD as PCP - General (Family Medicine) Franchot Gallo, MD as Consulting Physician (Urology)  Indicate any recent Medical Services you may have received from other than [facility] providers in the past year (date may be approximate).     Assessment:   This is a routine wellness examination for [name].  Hearing/Vision screen  Hearing Screening   125Hz  250Hz  500Hz  1000Hz  2000Hz  3000Hz  4000Hz  6000Hz  8000Hz   Right ear:  Left ear:           Vision Screening Comments: Patient states gets eyes checked annually at [facility]   Dietary issues and exercise activities discussed: Current Exercise Habits: The patient has a physically strenuous job, but has no regular exercise apart from work., Exercise limited by: None identified  Goals    . Exercise 150 min/wk Moderate Activity    . patient     Goal is to relax, set up his telescope at home      Depression Screen PHQ 2/9 Scores 08/13/2020 08/04/2016 03/28/2015  PHQ - 2 Score 0 0 0  PHQ- 9 Score 0 - -    Fall Risk Fall Risk  08/13/2020 08/04/2016 03/28/2015  Falls in the past year? 0 No No  Number falls in past yr: 0 - -  Injury with Fall? 0 - -  Risk for fall due to : No Fall Risks - -  Follow up Falls evaluation completed;Falls prevention discussed - -    FALL RISK PREVENTION PERTAINING TO THE HOME:  Any stairs in or around the home? Yes  If so, are there any without handrails? No  Home free of loose throw rugs in walkways, pet beds, electrical cords, etc? Yes  Adequate lighting in your home to reduce risk of falls? Yes   ASSISTIVE DEVICES UTILIZED TO PREVENT FALLS:  Life alert? No  Use of a cane, walker or w/c? No   Grab bars in the bathroom? No  Shower chair or bench in shower? No  Elevated toilet seat or a handicapped toilet? No     Cognitive Function: Cognitive screening not indicated based on direct observation. MMSE - Mini Mental State Exam 08/04/2016  Not completed: (No Data)        Immunizations Immunization History  Administered Date(s) Administered  . Fluad Quad(high Dose 65+) 05/29/2020  . Influenza, High Dose Seasonal PF 08/23/2014, 07/30/2015, 08/04/2016  . Influenza-Unspecified 08/23/2014, 07/09/2015, 05/28/2019  . PFIZER SARS-COV-2 Vaccination 10/27/2019, 11/15/2019  . Pneumococcal Conjugate-13 09/27/2015  . Pneumococcal Polysaccharide-23 04/10/2013  . Td 02/14/2017  . Tdap 04/11/2007  . Zoster 04/10/2013    TDAP status: Due, Education has been provided regarding the importance of this vaccine. Advised may receive this vaccine at local pharmacy or Health Dept. Aware to provide a copy of the vaccination record if obtained from local pharmacy or Health Dept. Verbalized acceptance and understanding.  Flu Vaccine status: Due, Education has been provided regarding the importance of this vaccine. Advised may receive this vaccine at local pharmacy or Health Dept. Aware to provide a copy of the vaccination record if obtained from local pharmacy or Health Dept. Verbalized acceptance and understanding.  Pneumococcal vaccine status: Up to date  Covid-19 vaccine status: Completed vaccines  Qualifies for Shingles Vaccine? Yes   Zostavax completed Yes   Shingrix Completed?: No.    Education has been provided regarding the importance of this vaccine. Patient has been advised to call insurance company to determine out of pocket expense if they have not yet received this vaccine. Advised may also receive vaccine at local pharmacy or Health Dept. Verbalized acceptance and understanding.  Screening Tests Health Maintenance  Topic Date Due  . COLONOSCOPY  11/07/2018  . TETANUS/TDAP   02/15/2027  . INFLUENZA VACCINE  Completed  . COVID-19 Vaccine  Completed  . Hepatitis C Screening  Completed  . PNA vac Low Risk Adult  Completed    Health Maintenance  Health Maintenance Due  Topic Date Due  .  COLONOSCOPY  11/07/2018    Colorectal cancer screening: Referral to GI placed 08/13/2020. Pt aware the office will call re: appt.  Lung Cancer Screening: (Low Dose CT Chest recommended if Age 25-80 years, 30 pack-year currently smoking OR have quit w/in 15years.) does not qualify.   Lung Cancer Screening Referral: N/A   Additional Screening:  Hepatitis C Screening: does qualify; Completed 12/23/2016  Vision Screening: Recommended annual ophthalmology exams for early detection of glaucoma and other disorders of the eye. Is the patient up to date with their annual eye exam?  Yes  Who is the provider or what is the name of the office in which the patient attends annual eye exams? [facility] If pt is not established with a provider, would they like to be referred to a provider to establish care? No .   Dental Screening: Recommended annual dental exams for proper oral hygiene  Community Resource Referral / Chronic Care Management: CRR required this visit?  No   CCM required this visit?  No      Plan:     I have personally reviewed and noted the following in the patient's chart:   . Medical and social history . Use of alcohol, tobacco or illicit drugs  . Current medications and supplements . Functional ability and status . Nutritional status . Physical activity . Advanced directives . List of other physicians . Hospitalizations, surgeries, and ER visits in previous 12 months . Vitals . Screenings to include cognitive, depression, and falls . Referrals and appointments  In addition, I have reviewed and discussed with patient certain preventive protocols, quality metrics, and best practice recommendations. A written personalized care plan for  preventive services as well as general preventive health recommendations were provided to patient.     Ofilia Neas, LPN   26/10/353   Nurse Notes: None

## 2020-08-13 ENCOUNTER — Ambulatory Visit (INDEPENDENT_AMBULATORY_CARE_PROVIDER_SITE_OTHER): Payer: Medicare Other

## 2020-08-13 ENCOUNTER — Other Ambulatory Visit: Payer: Self-pay

## 2020-08-13 DIAGNOSIS — Z0000 Encounter for general adult medical examination without abnormal findings: Secondary | ICD-10-CM

## 2020-08-13 NOTE — Patient Instructions (Signed)
[name] , Thank you for taking time to come for your Medicare Wellness Visit. I appreciate your ongoing commitment to your health goals. Please review the following plan we discussed and let me know if I can assist you in the future.   Screening recommendations/referrals: Colonoscopy: Please keep scheduled appointment for 08/16/2020 Recommended yearly ophthalmology/optometry visit for glaucoma screening and checkup Recommended yearly dental visit for hygiene and checkup  Vaccinations: Influenza vaccine: Up to date, next due fall 2022  Pneumococcal vaccine: Completed series  Tdap vaccine: Up to date, next due 04/11/2027 Shingles vaccine: Currently due for Shingrix, if you wish to receive we recommend that you do so at your local pharmacy as it is less expensive     Advanced directives: Advance directive discussed with you today. Even though you declined this today please call our office should you change your mind and we can give you the proper paperwork for you to fill out.   Conditions/risks identified: None   Next appointment: None   Preventive Care 72 Years and Older, Male Preventive care refers to lifestyle choices and visits with your health care provider that can promote health and wellness. What does preventive care include?  A yearly physical exam. This is also called an annual well check.  Dental exams once or twice a year.  Routine eye exams. Ask your health care provider how often you should have your eyes checked.  Personal lifestyle choices, including:  Daily care of your teeth and gums.  Regular physical activity.  Eating a healthy diet.  Avoiding tobacco and drug use.  Limiting alcohol use.  Practicing safe sex.  Taking low doses of aspirin every day.  Taking vitamin and mineral supplements as recommended by your health care provider. What happens during an annual well check? The services and screenings done by your health care provider during your  annual well check will depend on your age, overall health, lifestyle risk factors, and family history of disease. Counseling  Your health care provider may ask you questions about your:  Alcohol use.  Tobacco use.  Drug use.  Emotional well-being.  Home and relationship well-being.  Sexual activity.  Eating habits.  History of falls.  Memory and ability to understand (cognition).  Work and work Statistician. Screening  You may have the following tests or measurements:  Height, weight, and BMI.  Blood pressure.  Lipid and cholesterol levels. These may be checked every 5 years, or more frequently if you are over 80 years old.  Skin check.  Lung cancer screening. You may have this screening every year starting at age 72 if you have a 30-pack-year history of smoking and currently smoke or have quit within the past 15 years.  Fecal occult blood test (FOBT) of the stool. You may have this test every year starting at age 72.  Flexible sigmoidoscopy or colonoscopy. You may have a sigmoidoscopy every 5 years or a colonoscopy every 10 years starting at age 72.  Prostate cancer screening. Recommendations will vary depending on your family history and other risks.  Hepatitis C blood test.  Hepatitis B blood test.  Sexually transmitted disease (STD) testing.  Diabetes screening. This is done by checking your blood sugar (glucose) after you have not eaten for a while (fasting). You may have this done every 1-3 years.  Abdominal aortic aneurysm (AAA) screening. You may need this if you are a current or former smoker.  Osteoporosis. You may be screened starting at age 72 if you are  at high risk. Talk with your health care provider about your test results, treatment options, and if necessary, the need for more tests. Vaccines  Your health care provider may recommend certain vaccines, such as:  Influenza vaccine. This is recommended every year.  Tetanus, diphtheria, and  acellular pertussis (Tdap, Td) vaccine. You may need a Td booster every 10 years.  Zoster vaccine. You may need this after age 72.  Pneumococcal 13-valent conjugate (PCV13) vaccine. One dose is recommended after age 72.  Pneumococcal polysaccharide (PPSV23) vaccine. One dose is recommended after age 72. Talk to your health care provider about which screenings and vaccines you need and how often you need them. This information is not intended to replace advice given to you by your health care provider. Make sure you discuss any questions you have with your health care provider. Document Released: 09/20/2015 Document Revised: 05/13/2016 Document Reviewed: 06/25/2015 Elsevier Interactive Patient Education  2017 [employer] Prevention in the Home Falls can cause injuries. They can happen to people of all ages. There are many things you can do to make your home safe and to help prevent falls. What can I do on the outside of my home?  Regularly fix the edges of walkways and driveways and fix any cracks.  Remove anything that might make you trip as you walk through a door, such as a raised step or threshold.  Trim any bushes or trees on the path to your home.  Use bright outdoor lighting.  Clear any walking paths of anything that might make someone trip, such as rocks or tools.  Regularly check to see if handrails are loose or broken. Make sure that both sides of any steps have handrails.  Any raised decks and porches should have guardrails on the edges.  Have any leaves, snow, or ice cleared regularly.  Use sand or salt on walking paths during winter.  Clean up any spills in your garage right away. This includes oil or grease spills. What can I do in the bathroom?  Use night lights.  Install grab bars by the toilet and in the tub and shower. Do not use towel bars as grab bars.  Use non-skid mats or decals in the tub or shower.  If you need to sit down in the shower, use  a plastic, non-slip stool.  Keep the floor dry. Clean up any water that spills on the floor as soon as it happens.  Remove soap buildup in the tub or shower regularly.  Attach bath mats securely with double-sided non-slip rug tape.  Do not have throw rugs and other things on the floor that can make you trip. What can I do in the bedroom?  Use night lights.  Make sure that you have a light by your bed that is easy to reach.  Do not use any sheets or blankets that are too big for your bed. They should not hang down onto the floor.  Have a firm chair that has side arms. You can use this for support while you get dressed.  Do not have throw rugs and other things on the floor that can make you trip. What can I do in the kitchen?  Clean up any spills right away.  Avoid walking on wet floors.  Keep items that you use a lot in easy-to-reach places.  If you need to reach something above you, use a strong step stool that has a grab bar.  Keep electrical cords out of  the way.  Do not use floor polish or wax that makes floors slippery. If you must use wax, use non-skid floor wax.  Do not have throw rugs and other things on the floor that can make you trip. What can I do with my stairs?  Do not leave any items on the stairs.  Make sure that there are handrails on both sides of the stairs and use them. Fix handrails that are broken or loose. Make sure that handrails are as long as the stairways.  Check any carpeting to make sure that it is firmly attached to the stairs. Fix any carpet that is loose or worn.  Avoid having throw rugs at the top or bottom of the stairs. If you do have throw rugs, attach them to the floor with carpet tape.  Make sure that you have a light switch at the top of the stairs and the bottom of the stairs. If you do not have them, ask someone to add them for you. What else can I do to help prevent falls?  Wear shoes that:  Do not have high heels.  Have  rubber bottoms.  Are comfortable and fit you well.  Are closed at the toe. Do not wear sandals.  If you use a stepladder:  Make sure that it is fully opened. Do not climb a closed stepladder.  Make sure that both sides of the stepladder are locked into place.  Ask someone to hold it for you, if possible.  Clearly mark and make sure that you can see:  Any grab bars or handrails.  First and last steps.  Where the edge of each step is.  Use tools that help you move around (mobility aids) if they are needed. These include:  Canes.  Walkers.  Scooters.  Crutches.  Turn on the lights when you go into a dark area. Replace any light bulbs as soon as they burn out.  Set up your furniture so you have a clear path. Avoid moving your furniture around.  If any of your floors are uneven, fix them.  If there are any pets around you, be aware of where they are.  Review your medicines with your doctor. Some medicines can make you feel dizzy. This can increase your chance of falling. Ask your doctor what other things that you can do to help prevent falls. This information is not intended to replace advice given to you by your health care provider. Make sure you discuss any questions you have with your health care provider. Document Released: 06/20/2009 Document Revised: 01/30/2016 Document Reviewed: 09/28/2014 Elsevier Interactive Patient Education  2017 [employer].

## 2020-08-16 ENCOUNTER — Encounter: Payer: Self-pay | Admitting: Family Medicine

## 2020-08-16 DIAGNOSIS — Z1211 Encounter for screening for malignant neoplasm of colon: Secondary | ICD-10-CM | POA: Diagnosis not present

## 2020-08-16 DIAGNOSIS — K640 First degree hemorrhoids: Secondary | ICD-10-CM | POA: Diagnosis not present

## 2020-08-16 DIAGNOSIS — D126 Benign neoplasm of colon, unspecified: Secondary | ICD-10-CM | POA: Diagnosis not present

## 2020-08-28 DIAGNOSIS — B029 Zoster without complications: Secondary | ICD-10-CM | POA: Diagnosis not present

## 2020-09-30 DIAGNOSIS — C61 Malignant neoplasm of prostate: Secondary | ICD-10-CM | POA: Diagnosis not present

## 2020-10-21 DIAGNOSIS — C61 Malignant neoplasm of prostate: Secondary | ICD-10-CM | POA: Diagnosis not present

## 2020-10-21 DIAGNOSIS — N5201 Erectile dysfunction due to arterial insufficiency: Secondary | ICD-10-CM | POA: Diagnosis not present

## 2020-10-25 ENCOUNTER — Encounter: Payer: Self-pay | Admitting: Family Medicine

## 2021-01-18 IMAGING — MR MR LUMBAR SPINE WO/W CM
5 of 8 series · 24 of 48 positions shown · IV contrast (17ml Multihance)
Comparison: Bone scan 06/19/2019

CLINICAL DATA: Thoracolumbar spine pain.

EXAM:
MRI LUMBAR SPINE WITHOUT AND WITH CONTRAST
TECHNIQUE: Multiplanar and multiecho pulse sequences of the lumbar spine were
obtained without and with intravenous contrast.
CONTRAST:  17mL MULTIHANCE GADOBENATE DIMEGLUMINE 529 MG/ML IV SOLN

[Series 1: T1 · sagittal · 4.0mm · 0.88mm/px · 3 of 17 slices shown (1 of 3)]
[im 1/17]
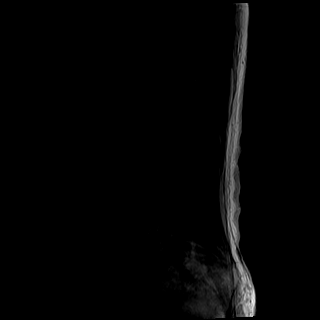
[im 9/17]
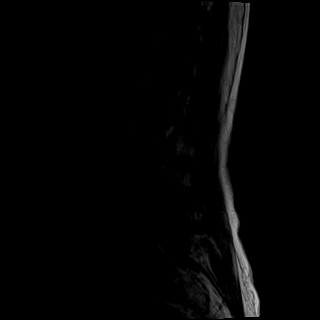
[im 17/17]
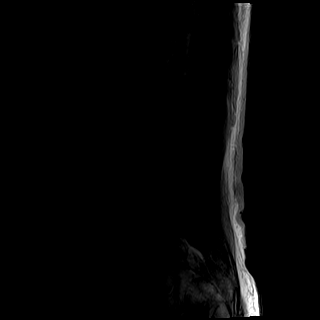

[Series 3: T1 · sagittal · 4.0mm · 0.88mm/px · 4 of 17 slices shown (2 of 3)]
[im 1/17]
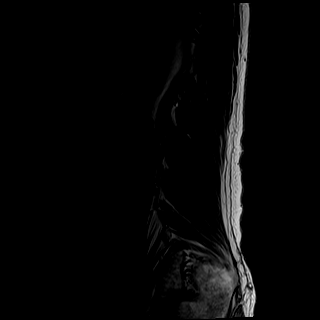
[im 6/17]
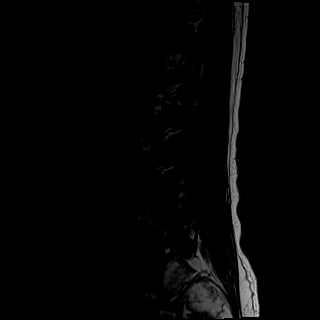
[im 11/17]
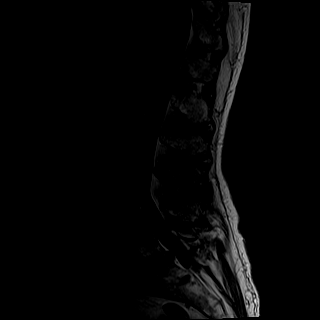
[im 17/17]
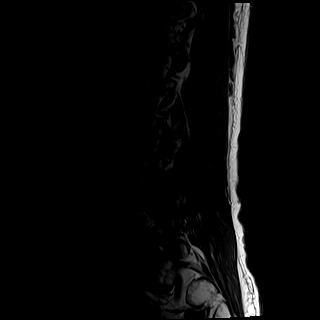

[Series 4: T2 · axial · 4.0mm · 0.39mm/px · z∈[-454,-217]mm · 8 of 44 slices shown (1 of 2)]
[im 1/44]
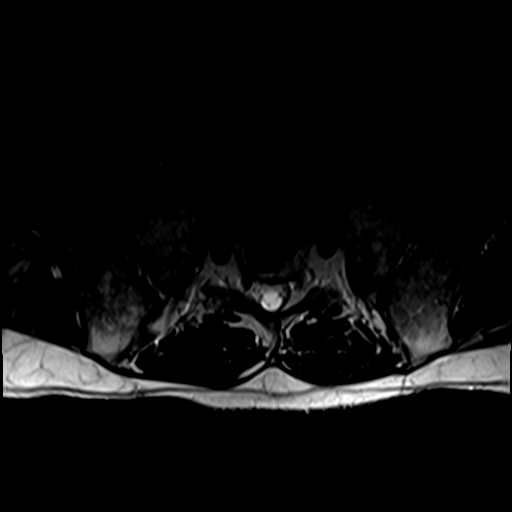
[im 5/44]
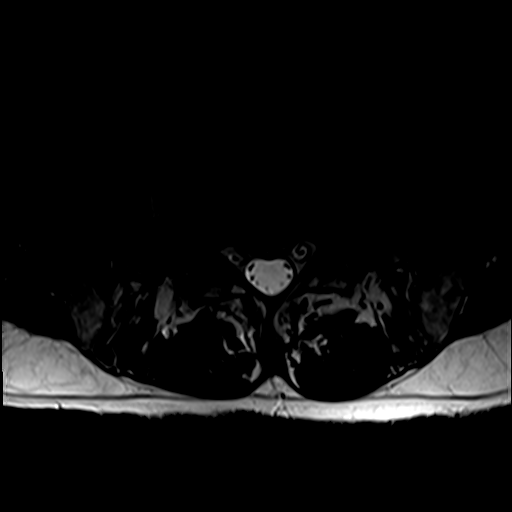
[im 15/44]
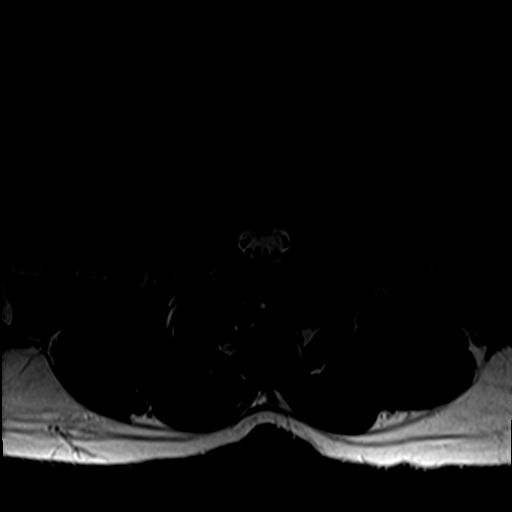
[im 20/44]
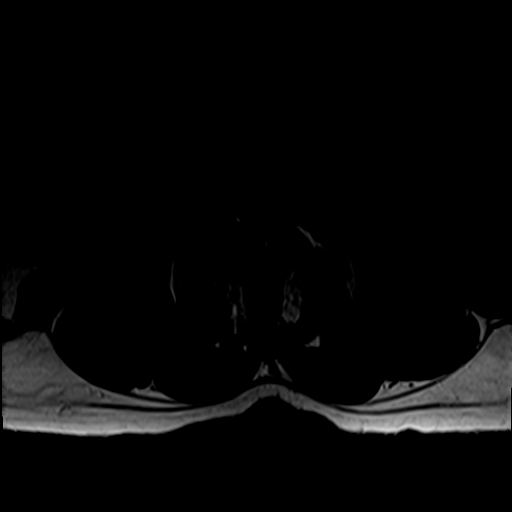
[im 24/44]
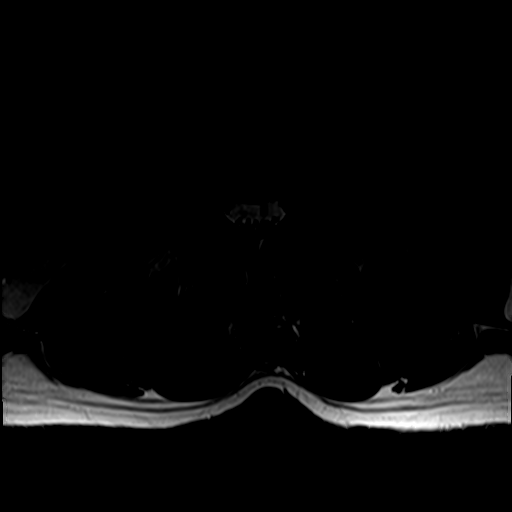
[im 29/44]
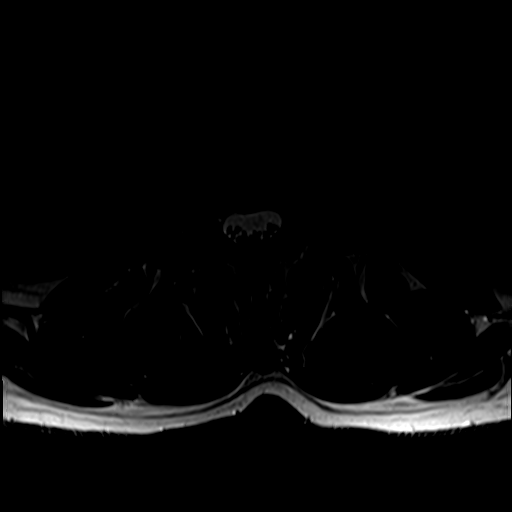
[im 39/44]
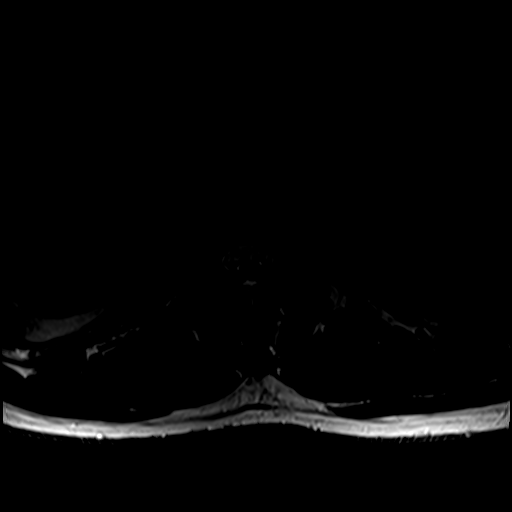
[im 44/44]
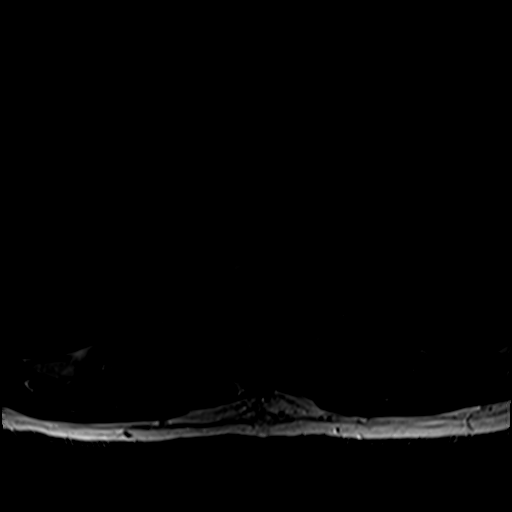

[Series 5: T1 · axial · 4.0mm · 0.39mm/px · z∈[-454,-313]mm · 5 of 44 slices shown (3 of 3)]
[im 1/44]
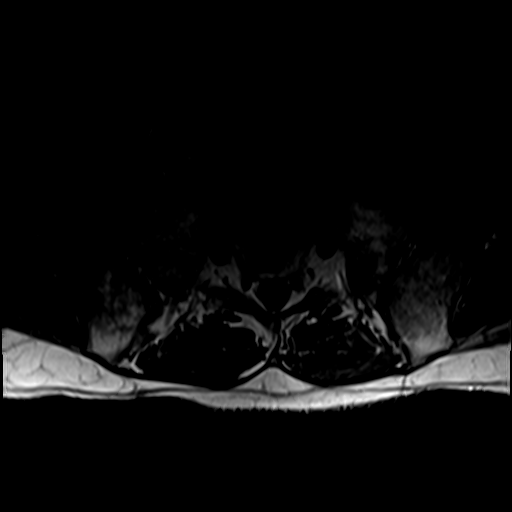
[im 5/44]
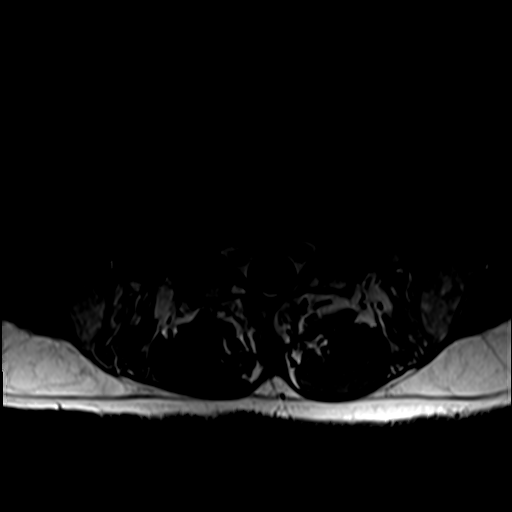
[im 15/44]
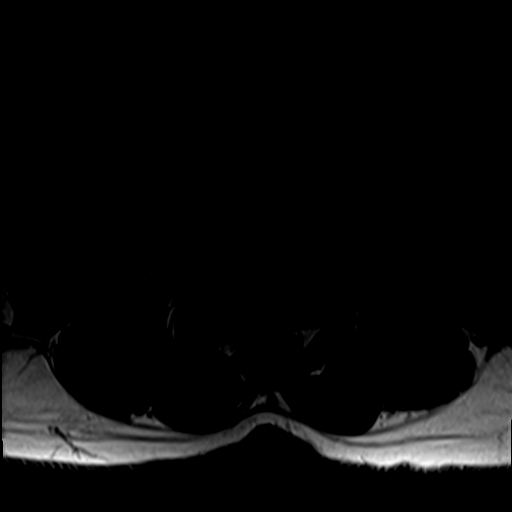
[im 20/44]
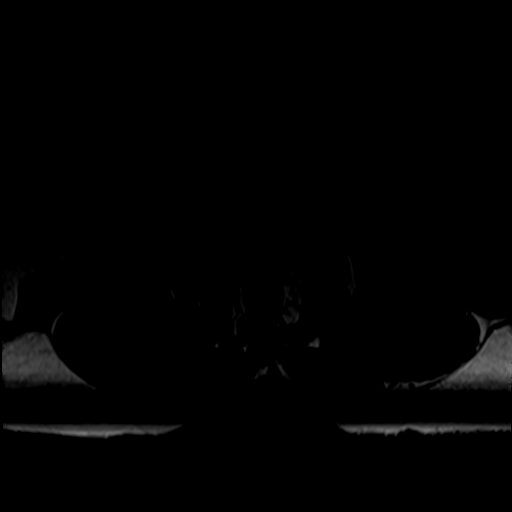
[im 24/44]
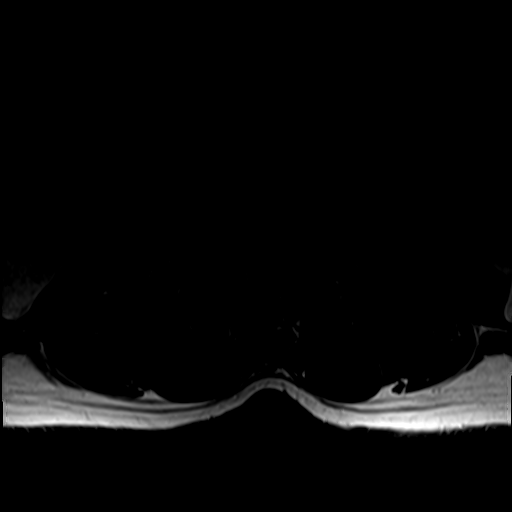

[Series 6: T2 · sagittal · 4.0mm · 1.09mm/px · 4 of 17 slices shown (2 of 2)]
[im 1/17]
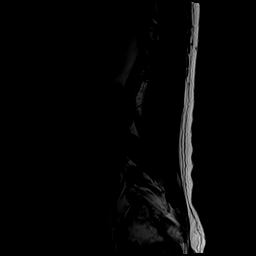
[im 6/17]
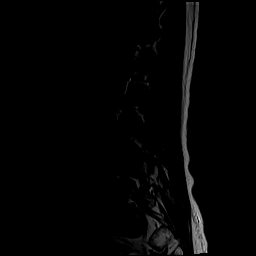
[im 11/17]
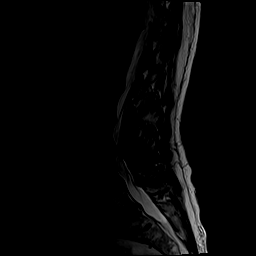
[im 17/17]
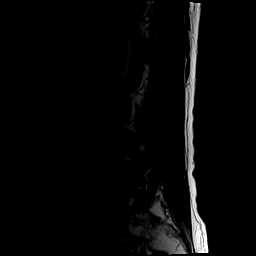

[24 of 48 positions shown; findings below may reference images not displayed]

FINDINGS: Segmentation:  Standard.

Alignment: 2 mm anterolisthesis of L3 on L4 and L4 on L5 secondary
to facet disease.

Vertebrae: No fracture, evidence of discitis, or aggressive bone
lesion. Subcortical irregularity of the spinous processes from L3
through L5.

Conus medullaris and cauda equina: Conus extends to the L1 level.
Conus and cauda equina appear normal.

Paraspinal and other soft tissues: No acute paraspinal abnormality.

Disc levels:

Disc spaces: Degenerative disease with disc height loss at T11-12
and L5-S1. Disc desiccation at L3-4 and L4-5.

T11-12: Mild broad-based disc osteophyte complex. Mild bilateral
facet arthropathy. Mild bilateral foraminal stenosis. No central
canal stenosis.

T12-L1: No significant disc bulge. No evidence of neural foraminal
stenosis. No central canal stenosis.

L1-L2: No significant disc bulge. No evidence of neural foraminal
stenosis. No central canal stenosis.

L2-L3: Minimal broad-based disc bulge. Mild bilateral facet
arthropathy. No evidence of neural foraminal stenosis. No central
canal stenosis.

L3-L4: Broad-based disc osteophyte complex with a more focal right
paracentral component flattening the ventral thecal sac. Severe
bilateral facet arthropathy with bilateral facet effusions. Severe
spinal stenosis. Mild bilateral foraminal stenosis.

L4-L5: Broad-based disc bulge. Severe bilateral facet arthropathy
with ligamentum flavum infolding and bilateral facet effusions.
Moderate right foraminal stenosis. No left foraminal stenosis. Right
subarticular recess stenosis. Mild spinal stenosis.

L5-S1: Mild broad-based disc bulge with a small central disc
protrusion. Mild bilateral facet arthropathy. Moderate bilateral
foraminal stenosis. No central canal stenosis.
IMPRESSION: 1. No aggressive osseous lesion to suggest metastatic disease.
Finding seen on recent bone scan likely reflect degenerative
changes.
2. At L3-4 there is a broad-based disc osteophyte complex with a
more focal right paracentral component flattening the ventral thecal
sac. Severe bilateral facet arthropathy with bilateral facet
effusions. Severe spinal stenosis. Mild bilateral foraminal
stenosis.
3. At L4-5 there is a broad-based disc bulge. Severe bilateral facet
arthropathy with ligamentum flavum infolding and bilateral facet
effusions. Moderate right foraminal stenosis. No left foraminal
stenosis. Right subarticular recess stenosis. Mild spinal stenosis.
4. At L5-S1 there is a mild broad-based disc bulge with a small
central disc protrusion. Mild bilateral facet arthropathy. Moderate
bilateral foraminal stenosis.

## 2021-01-18 IMAGING — MR MR THORACIC SPINE WO/W CM
4 of 9 series · 19 of 48 positions shown · IV contrast (multihance)
Comparison: None.

CLINICAL DATA: Abnormal bone scan recently on 06/19/2019.
Heterogeneous increased radiotracer uptake in the thoracolumbar
spine. History of prostate cancer.

EXAM:
MRI THORACIC WITHOUT AND WITH CONTRAST
TECHNIQUE: Multiplanar and multiecho pulse sequences of the thoracic spine were
obtained without and with intravenous contrast.
CONTRAST:  17mL MULTIHANCE GADOBENATE DIMEGLUMINE 529 MG/ML IV SOLN

[Series 4: T2 · sagittal · 4.0mm · 0.47mm/px · 3 of 17 slices shown (1 of 3)]
[im 1/17]
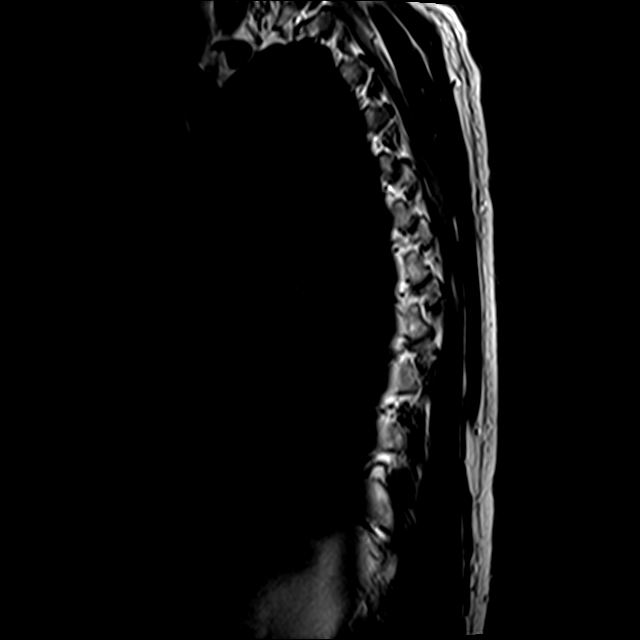
[im 9/17]
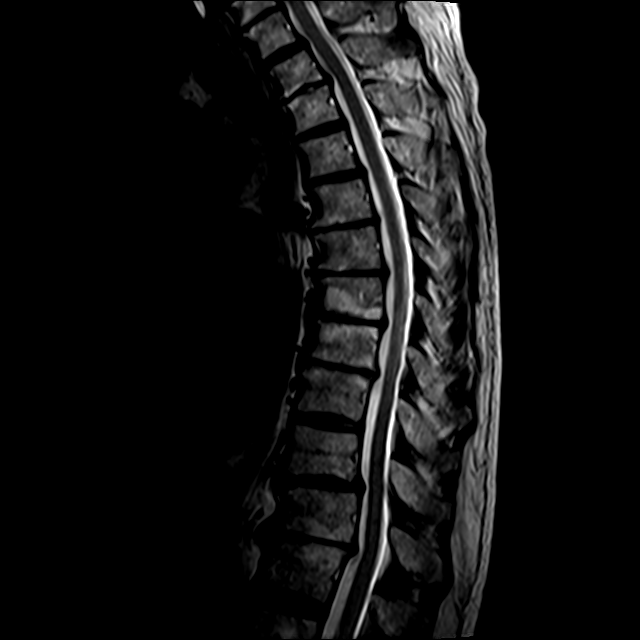
[im 17/17]
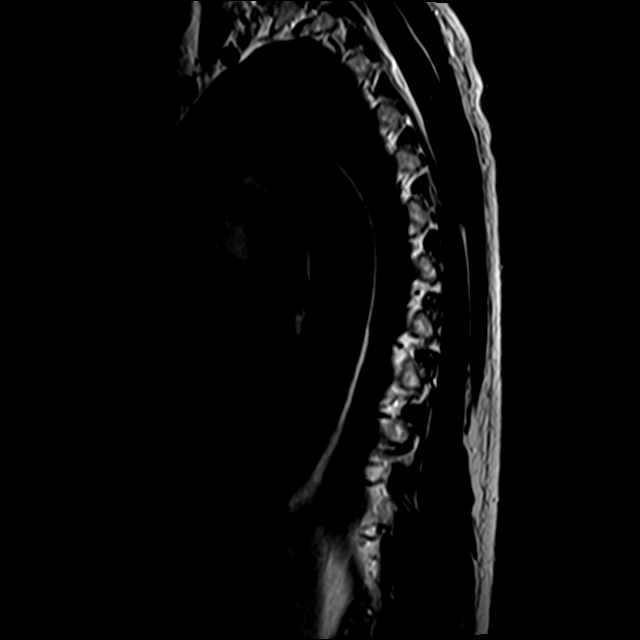

[Series 6: T1 · sagittal · 4.0mm · 0.94mm/px · 3 of 17 slices shown]
[im 1/17]
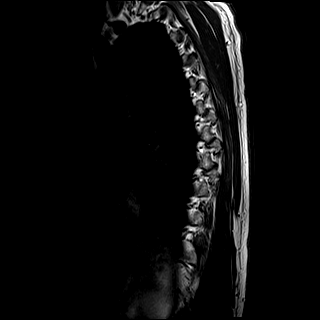
[im 9/17]
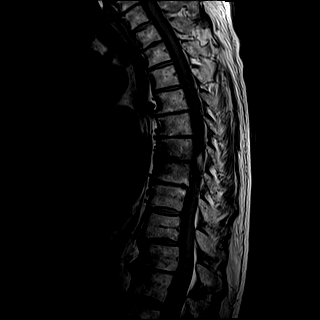
[im 17/17]
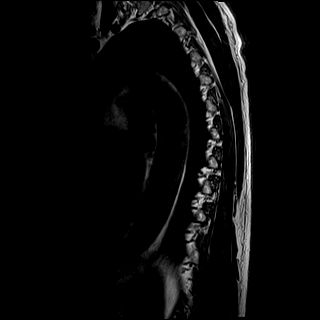

[Series 7: T2 · axial · 4.0mm · 0.39mm/px · z∈[-225,+3]mm · 8 of 44 slices shown (2 of 3)]
[im 1/44]
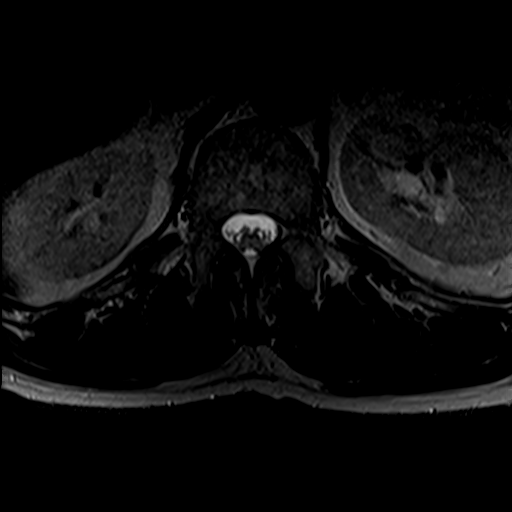
[im 7/44]
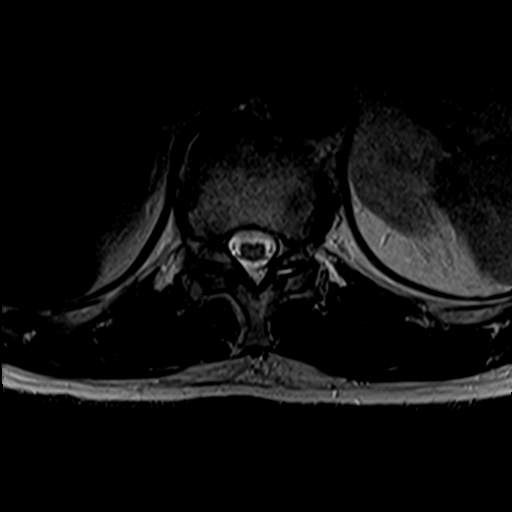
[im 13/44]
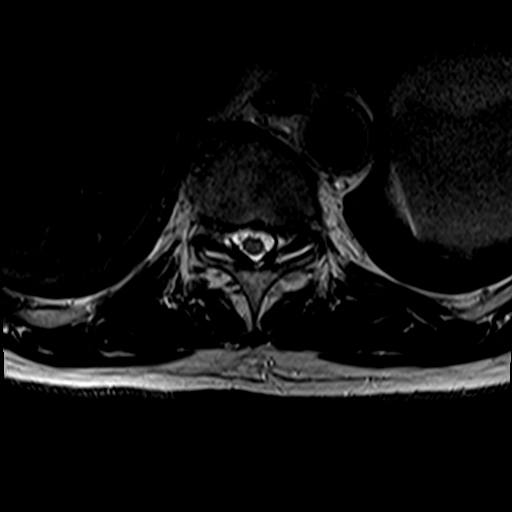
[im 19/44]
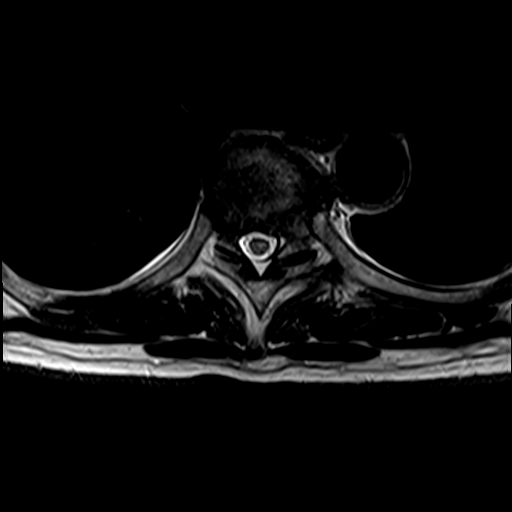
[im 25/44]
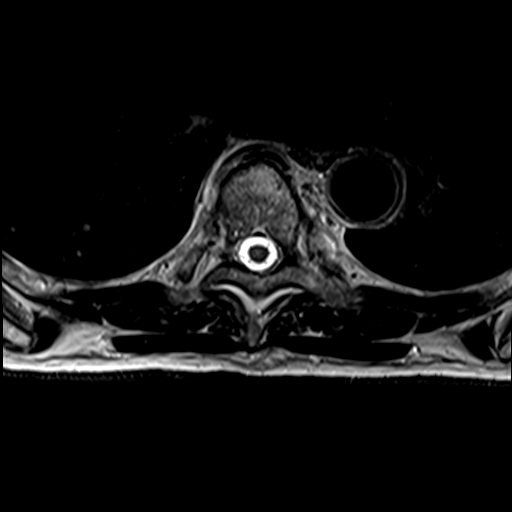
[im 31/44]
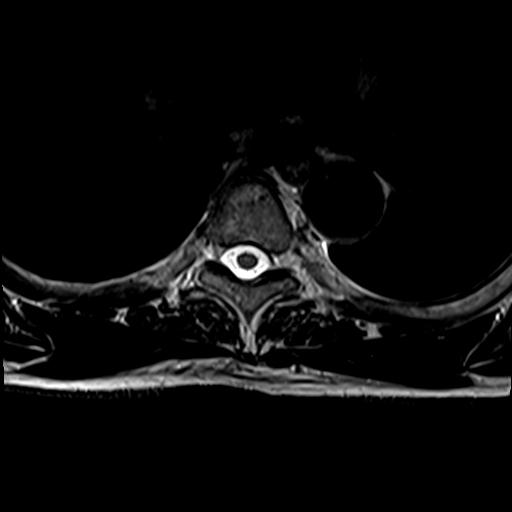
[im 37/44]
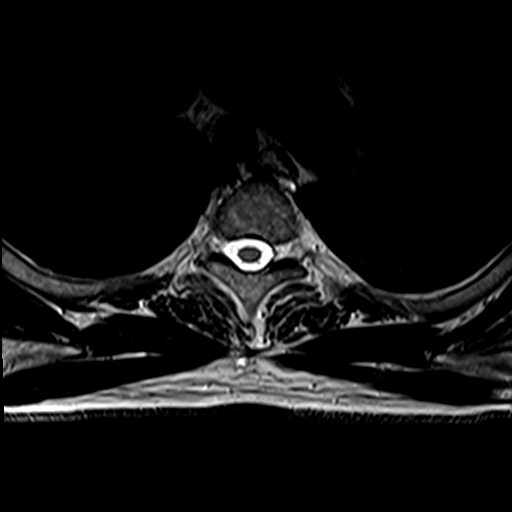
[im 44/44]
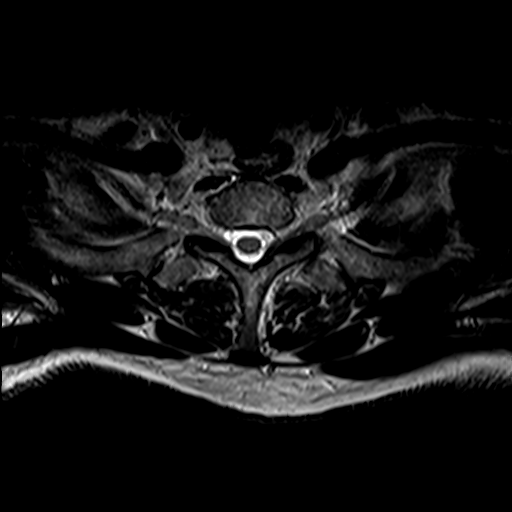

[Series 8: T2 · axial · 4.0mm · 0.39mm/px · z∈[-225,-35]mm · 5 of 44 slices shown (3 of 3)]
[im 1/44]
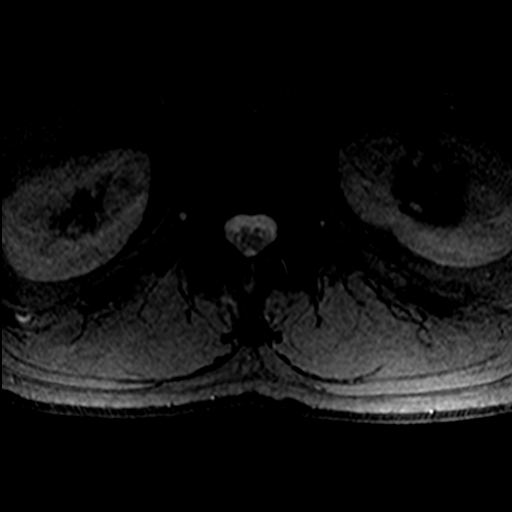
[im 7/44]
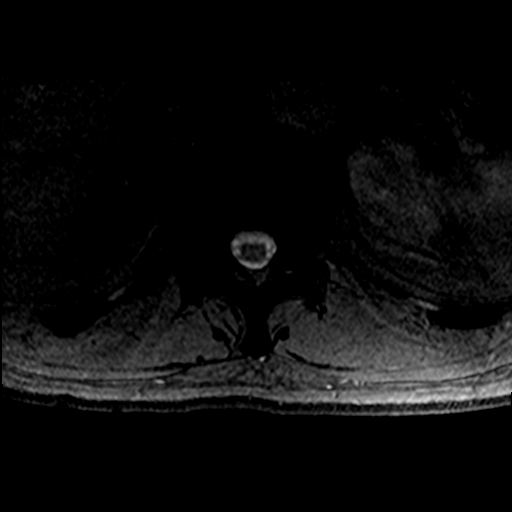
[im 13/44]
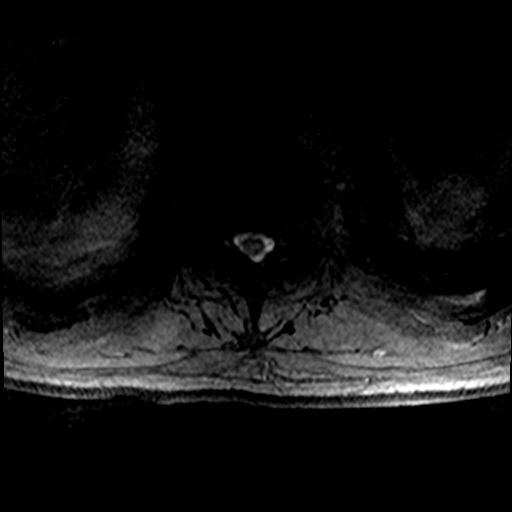
[im 25/44]
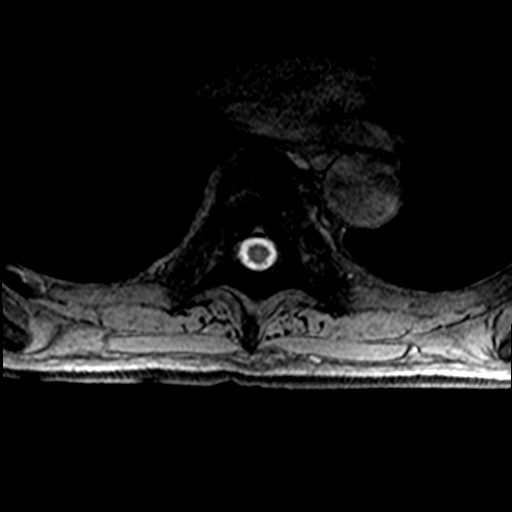
[im 37/44]
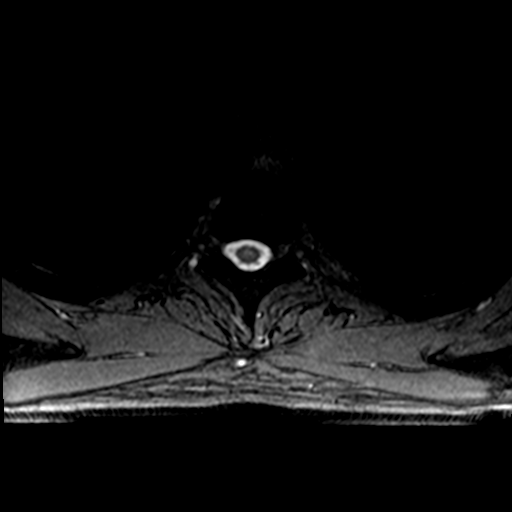

[19 of 48 positions shown; findings below may reference images not displayed]

FINDINGS: MRI THORACIC SPINE FINDINGS

Alignment:  Physiologic.

Vertebrae: Minimal grade 1 anterolisthesis of T10 on T11.

Cord:  Normal signal and morphology.

Paraspinal and other soft tissues: No acute paraspinal abnormality.

Disc levels:

Disc spaces: Degenerative disease with disc height loss at C7-T1,
T1-2, T2-3, T5-6, T6-7, T7-8 with reactive endplate edema, T8-9,
T10-11 and T11-12.

T1-T2: Minimal broad-based disc bulge. No foraminal stenosis. No
central canal stenosis.

T2-T3: Mild broad-based disc bulge. Mild bilateral foraminal
stenosis. No central canal stenosis.

T3-T4: No disc protrusion, foraminal stenosis or central canal
stenosis.

T4-T5: Mild broad-based disc bulge. No foraminal or central canal
stenosis.

T5-T6: Minimal broad-based disc bulge. No foraminal or central canal
stenosis.

T6-T7: Mild broad-based disc bulge. No foraminal or central canal
stenosis.

T7-T8: Mild broad-based disc bulge. No foraminal or central canal
stenosis.

T8-T9: Mild broad-based disc bulge. Mild bilateral facet
arthropathy. Mild bilateral foraminal stenosis. No central canal
stenosis.

T9-T10: No disc protrusion, foraminal stenosis or central canal
stenosis.

T10-T11: Mild broad-based disc bulge. Moderate bilateral facet
arthropathy. Moderate right and severe left foraminal stenosis. No
central canal stenosis.

T11-T12: Mild broad-based disc bulge. Moderate bilateral facet
arthropathy. Severe right and moderate left foraminal stenosis. No
central canal stenosis.
IMPRESSION: 1. No aggressive osseous lesion to suggest metastatic disease.
2. Thoracic spine spondylosis as described above.
3.  No acute osseous injury of the thoracic spine.

## 2021-04-04 DIAGNOSIS — C61 Malignant neoplasm of prostate: Secondary | ICD-10-CM | POA: Diagnosis not present

## 2021-04-18 ENCOUNTER — Other Ambulatory Visit: Payer: Self-pay

## 2021-04-21 ENCOUNTER — Ambulatory Visit (INDEPENDENT_AMBULATORY_CARE_PROVIDER_SITE_OTHER): Payer: Medicare Other | Admitting: Family Medicine

## 2021-04-21 ENCOUNTER — Encounter: Payer: Self-pay | Admitting: Family Medicine

## 2021-04-21 ENCOUNTER — Other Ambulatory Visit: Payer: Self-pay

## 2021-04-21 VITALS — BP 140/90 | HR 70 | Temp 97.9°F | Ht 72.0 in | Wt 184.4 lb

## 2021-04-21 DIAGNOSIS — N400 Enlarged prostate without lower urinary tract symptoms: Secondary | ICD-10-CM | POA: Diagnosis not present

## 2021-04-21 DIAGNOSIS — Z0000 Encounter for general adult medical examination without abnormal findings: Secondary | ICD-10-CM | POA: Diagnosis not present

## 2021-04-21 LAB — BASIC METABOLIC PANEL
BUN: 22 mg/dL (ref 6–23)
CO2: 27 mEq/L (ref 19–32)
Calcium: 9.1 mg/dL (ref 8.4–10.5)
Chloride: 102 mEq/L (ref 96–112)
Creatinine, Ser: 1 mg/dL (ref 0.40–1.50)
GFR: 74.89 mL/min (ref >60.00)
Glucose, Bld: 95 mg/dL (ref 70–99)
Potassium: 4 mEq/L (ref 3.5–5.1)
Sodium: 137 mEq/L (ref 135–145)

## 2021-04-21 LAB — TSH: TSH: 1.47 u[IU]/mL (ref 0.35–5.50)

## 2021-04-21 LAB — CBC WITH DIFFERENTIAL/PLATELET
Basophils Absolute: 0 10*3/uL (ref 0.0–0.1)
Basophils Relative: 0.3 % (ref 0.0–3.0)
Eosinophils Absolute: 0.1 10*3/uL (ref 0.0–0.7)
Eosinophils Relative: 1.8 % (ref 0.0–5.0)
HCT: 47 % (ref 39.0–52.0)
Hemoglobin: 16.2 g/dL (ref 13.0–17.0)
Lymphocytes Relative: 32.9 % (ref 12.0–46.0)
Lymphs Abs: 1.7 10*3/uL (ref 0.7–4.0)
MCHC: 34.3 g/dL (ref 30.0–36.0)
MCV: 94.7 fl (ref 78.0–100.0)
Monocytes Absolute: 0.4 10*3/uL (ref 0.1–1.0)
Monocytes Relative: 7.3 % (ref 3.0–12.0)
Neutro Abs: 2.9 10*3/uL (ref 1.4–7.7)
Neutrophils Relative %: 57.7 % (ref 43.0–77.0)
Platelets: 249 10*3/uL (ref 150.0–400.0)
RBC: 4.96 Mil/uL (ref 4.22–5.81)
RDW: 12.7 % (ref 11.5–15.5)
WBC: 5 10*3/uL (ref 4.0–10.5)

## 2021-04-21 LAB — LIPID PANEL
Cholesterol: 197 mg/dL (ref 0–200)
HDL: 50.1 mg/dL (ref >39.00)
LDL Cholesterol: 123 mg/dL — ABNORMAL HIGH (ref 0–99)
NonHDL: 146.65
Total CHOL/HDL Ratio: 4
Triglycerides: 118 mg/dL (ref 0.0–149.0)
VLDL: 23.6 mg/dL (ref 0.0–40.0)

## 2021-04-21 LAB — HEPATIC FUNCTION PANEL
ALT: 19 U/L (ref 0–53)
AST: 18 U/L (ref 0–37)
Albumin: 4.3 g/dL (ref 3.5–5.2)
Alkaline Phosphatase: 76 U/L (ref 39–117)
Bilirubin, Direct: 0.2 mg/dL (ref 0.0–0.3)
Total Bilirubin: 1.1 mg/dL (ref 0.2–1.2)
Total Protein: 6.8 g/dL (ref 6.0–8.3)

## 2021-04-21 NOTE — Progress Notes (Signed)
Established Patient Office Visit  Subjective:  Patient ID: Alan Hodges, male    DOB: 03/17/1946  Age: 75 y.o. MRN: [id number]  CC:  Chief Complaint  Patient presents with   Annual Exam    No new concerns    HPI Alan Hodges presents for physical exam.  He has history of prostate cancer.  He is concerned because recent PSAs have been increasing slightly.  He has follow-up with urologist later today.  His problems include history of hypertension, osteoarthritis, prostate cancer.  He had previous radiation seed implants.  Home blood pressures have been very well controlled with most readings less than [id number] systolic and less than 84 diastolic.  He had colonoscopy 12/21.  Tetanus is due 2028.  Pneumonia vaccines complete.  Previous hepatitis C antibody negative.  He had previous Zostavax.  He had clinical case of shingles which was relatively mild earlier this year.  He is willing to consider Shingrix at some point.  Family history-mother had history of hypertension.  Father type 2 diabetes.  He has 2 sisters which are relatively healthy.  Social history-he lives with significant other.  He has approximately [address] acres in [place] of [facility].  Never smoked.  He is retired.  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    Past Surgical History:  Procedure Laterality Date   BUNIONECTOMY  1994   right   cataracts Bilateral    CERVICAL FUSION  1991   COLONOSCOPY     x2   KNEE  ARTHROSCOPY  [id number]   right kneex3   lefr arthroscopy     open x 2   PROSTATE BIOPSY     x 2   RADIOACTIVE SEED IMPLANT N/A 09/28/2019   Procedure: RADIOACTIVE SEED IMPLANT/BRACHYTHERAPY IMPLANT;  Surgeon: Franchot Gallo, MD;  Location: [place];  Service: Urology;  Laterality: N/A;  90 MINS   SHOULDER SURGERY Left 2008   SPACE OAR INSTILLATION N/A 09/28/2019   Procedure: SPACE OAR INSTILLATION;  Surgeon: Franchot Gallo, MD;  Location: [facility];  Service: Urology;  Laterality: N/A;    Family History  Problem Relation Age of Onset   Hypertension Mother    Diabetes Father    Cancer Neg Hx    Breast cancer Neg Hx    Prostate cancer Neg Hx    Colon cancer Neg Hx     Social History   Socioeconomic History   Marital status: Single    Spouse name: Not on file   Number of children: Not on file   Years of education: Not on file   Highest education level: Not on file  Occupational History   Occupation: retired in 2014  Tobacco Use   Smoking status: Never   Smokeless tobacco: Never  Vaping Use   Vaping Use: Never used  Substance and Sexual Activity   Alcohol use: Yes    Comment: daily 3 beers night   Drug use: No   Sexual activity: Not Currently  Other Topics Concern   Not on file  Social History Narrative   Not on file   Social Determinants of Health   Financial Resource Strain: Low Risk    Difficulty of Paying Living Expenses: Not hard at all  Food Insecurity: No Food Insecurity   Worried About Charity fundraiser in the Last Year: Never true   [place] in the Last Year: Never true  Transportation Needs: No Transportation Needs   Lack of Transportation (Medical): No   Lack of Transportation (Non-Medical): No  Physical Activity: Inactive   Days of Exercise per Week: 0 days   Minutes of Exercise per Session: 0 min  Stress: No Stress Concern Present   Feeling of Stress : Not at all  Social Connections: Socially  Isolated   Frequency of Communication with Friends and Family: Once a week   Frequency of Social Gatherings with Friends and Family: Once a week   Attends Religious Services: Never   Marine scientist or Organizations: No   Attends Music therapist: Never   Marital Status: Living with partner  Intimate Partner Violence: Not At Risk   Fear of Current or Ex-Partner: No   Emotionally Abused: No   Physically Abused: No   Sexually Abused: No    Outpatient Medications Prior to Visit  Medication Sig Dispense Refill   amLODipine (NORVASC) 5 MG tablet TAKE 1 TABLET BY MOUTH EVERY DAY 90 tablet 3   ibuprofen (ADVIL,MOTRIN) 200 MG tablet Take 200 mg by mouth every 8 (eight) hours as needed (just as needed).     lisinopril (ZESTRIL) 10 MG tablet TAKE 1 TABLET BY MOUTH EVERY DAY 90 tablet 3   Melatonin 3 MG TABS Take by mouth at bedtime as needed.      No facility-administered medications prior to visit.    No Known Allergies  ROS Review of Systems  Constitutional:  Negative for appetite change, fever and unexpected weight change.  Respiratory:  Negative for cough and shortness of breath.   Cardiovascular:  Negative for chest pain and leg swelling.  Gastrointestinal:  Negative for abdominal pain, blood in stool, nausea and vomiting.  Genitourinary:  Negative for dysuria.  Musculoskeletal:  Positive for arthralgias.  Neurological:  Negative for weakness.  Hematological:  Negative for adenopathy.  Psychiatric/Behavioral:  Negative for dysphoric mood.      Objective:    Physical Exam Constitutional:      Appearance: Normal appearance. He is well-developed.  HENT:     Right Ear: External ear normal.     Left Ear: External ear normal.  Eyes:     Pupils: Pupils are equal, round, and reactive to light.  Neck:     Thyroid: No thyromegaly.  Cardiovascular:     Rate and Rhythm: Normal rate and regular rhythm.  Pulmonary:     Effort: Pulmonary effort is normal. No  respiratory distress.     Breath sounds: Normal breath sounds. No wheezing or rales.  Musculoskeletal:     Cervical back: Neck supple.     Right lower leg: No edema.     Left lower leg: No edema.  Neurological:     Mental Status: He is alert and oriented to person, place, and time.    BP 140/90 (BP Location: Left Arm, Patient Position: Sitting, Cuff Size: Normal)   Pulse 70   Temp 97.9 F (36.6 C) (Oral)   Ht 6' (1.829 m)   Wt 184 lb 6.4 oz (83.6  kg)   SpO2 98%   BMI 25.01 kg/m  Wt Readings from Last 3 Encounters:  04/21/21 184 lb 6.4 oz (83.6 kg)  05/29/20 181 lb 6.4 oz (82.3 kg)  09/28/19 185 lb (83.9 kg)     Health Maintenance Due  Topic Date Due   Zoster Vaccines- Shingrix (1 of 2) Never done   COVID-19 Vaccine (3 - Pfizer risk series) 12/13/2019   INFLUENZA VACCINE  04/07/2021    There are no preventive care reminders to display for this patient.  Lab Results  Component Value Date   TSH 1.11 05/29/2020   Lab Results  Component Value Date   WBC 4.8 05/29/2020   HGB 15.6 05/29/2020   HCT 44.9 05/29/2020   MCV 93.9 05/29/2020   PLT 241 05/29/2020   Lab Results  Component Value Date   NA 139 05/29/2020   K 3.8 05/29/2020   CO2 27 05/29/2020   GLUCOSE 103 (H) 05/29/2020   BUN 18 05/29/2020   CREATININE 0.91 05/29/2020   BILITOT 0.9 05/29/2020   ALKPHOS 71 09/25/2019   AST 15 05/29/2020   ALT 16 05/29/2020   PROT 6.6 05/29/2020   ALBUMIN 4.1 09/25/2019   CALCIUM 9.1 05/29/2020   ANIONGAP 7 09/25/2019   GFR 80.02 07/19/2019   Lab Results  Component Value Date   CHOL 183 05/29/2020   Lab Results  Component Value Date   HDL 53 05/29/2020   Lab Results  Component Value Date   LDLCALC 113 (H) 05/29/2020   Lab Results  Component Value Date   TRIG 77 05/29/2020   Lab Results  Component Value Date   CHOLHDL 3.5 05/29/2020   No results found for: HGBA1C    Assessment & Plan:   Problem List Items Addressed This Visit   None Visit  Diagnoses     Physical exam    -  Primary   Relevant Orders   Basic metabolic panel   Lipid panel   CBC with Differential/Platelet   TSH   Hepatic function panel     Patient presents today for physical exam.  Blood pressure is up slightly today in office but consistently better controlled at home.  He states he had to rush to get here in time and had a long drive this morning.  He also has follow-up with urologist later today and he is somewhat anxious about that.  -Recommend annual flu vaccine -We did discuss Shingrix vaccine and he will check on insurance coverage and consider getting this at some point this year -Obtain labs as above.  Not getting PSA as he gets this through urology -Other vaccines up-to-date.  Colonoscopy up-to-date.  No orders of the defined types were placed in this encounter.   Follow-up: No follow-ups on file.    Carolann Littler, MD

## 2021-05-16 ENCOUNTER — Encounter: Payer: Self-pay | Admitting: Family Medicine

## 2021-05-20 DIAGNOSIS — C61 Malignant neoplasm of prostate: Secondary | ICD-10-CM | POA: Diagnosis not present

## 2021-06-02 DIAGNOSIS — Z8546 Personal history of malignant neoplasm of prostate: Secondary | ICD-10-CM | POA: Diagnosis not present

## 2021-06-02 DIAGNOSIS — C61 Malignant neoplasm of prostate: Secondary | ICD-10-CM | POA: Diagnosis not present

## 2021-06-10 ENCOUNTER — Other Ambulatory Visit (HOSPITAL_COMMUNITY): Payer: Self-pay | Admitting: Urology

## 2021-06-10 DIAGNOSIS — C61 Malignant neoplasm of prostate: Secondary | ICD-10-CM

## 2021-06-23 ENCOUNTER — Other Ambulatory Visit: Payer: Self-pay

## 2021-06-23 ENCOUNTER — Encounter (HOSPITAL_COMMUNITY)
Admission: RE | Admit: 2021-06-23 | Discharge: 2021-06-23 | Disposition: A | Discharge status: Home / Self Care | Payer: Medicare Other | Source: Ambulatory Visit | Attending: Urology | Admitting: Urology

## 2021-06-23 DIAGNOSIS — I700 Atherosclerosis of aorta: Secondary | ICD-10-CM | POA: Diagnosis not present

## 2021-06-23 DIAGNOSIS — I719 Aortic aneurysm of unspecified site, without rupture: Secondary | ICD-10-CM | POA: Diagnosis not present

## 2021-06-23 DIAGNOSIS — C61 Malignant neoplasm of prostate: Secondary | ICD-10-CM | POA: Diagnosis not present

## 2021-06-23 MED ORDER — PIFLIFOLASTAT F 18 (PYLARIFY) INJECTION
9.0000 | Freq: Once | INTRAVENOUS | Status: AC
  Administered 2021-06-23: 9.55 via INTRAVENOUS

## 2021-06-25 ENCOUNTER — Encounter: Payer: Self-pay | Admitting: Family Medicine

## 2021-07-14 DIAGNOSIS — C61 Malignant neoplasm of prostate: Secondary | ICD-10-CM | POA: Diagnosis not present

## 2021-07-14 DIAGNOSIS — H5213 Myopia, bilateral: Secondary | ICD-10-CM | POA: Diagnosis not present

## 2021-08-01 ENCOUNTER — Encounter: Payer: Self-pay | Admitting: Family Medicine

## 2021-08-08 ENCOUNTER — Other Ambulatory Visit: Payer: Self-pay | Admitting: Family Medicine

## 2021-08-14 ENCOUNTER — Ambulatory Visit (INDEPENDENT_AMBULATORY_CARE_PROVIDER_SITE_OTHER): Payer: Medicare Other

## 2021-08-14 VITALS — Ht 72.0 in | Wt 184.0 lb

## 2021-08-14 DIAGNOSIS — Z0000 Encounter for general adult medical examination without abnormal findings: Secondary | ICD-10-CM

## 2021-08-14 NOTE — Progress Notes (Signed)
Subjective:   Alan Hodges is a 75 y.o. male who presents for Medicare Annual/Subsequent preventive examination.  Review of Systems    No ROS      Objective:    Today's Vitals   08/14/21 0815  Weight: 184 lb (83.5 kg)  Height: 6' (1.829 m)   Body mass index is 24.95 kg/m.  Advanced Directives 08/14/2021 08/13/2020 10/12/2019 09/28/2019 07/21/2019 08/04/2016 07/01/2012  Does Patient Have a Medical Advance Directive? No No No No No Yes Patient has advance directive, copy not in chart  Would patient like information on creating a medical advance directive? No - Patient declined No - Patient declined - Yes (MAU/Ambulatory/Procedural Areas - Information given) Yes (MAU/Ambulatory/Procedural Areas - Information given) - -    Current Medications (verified) Outpatient Encounter Medications as of 08/14/2021  Medication Sig   amLODipine (NORVASC) 5 MG tablet TAKE 1 TABLET BY MOUTH EVERY DAY   ibuprofen (ADVIL,MOTRIN) 200 MG tablet Take 200 mg by mouth every 8 (eight) hours as needed (just as needed).   lisinopril (ZESTRIL) 10 MG tablet TAKE 1 TABLET BY MOUTH EVERY DAY   Melatonin 3 MG TABS Take by mouth at bedtime as needed.    No facility-administered encounter medications on file as of 08/14/2021.    Allergies (verified) Patient has no known allergies.   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   Past Surgical History:  Procedure Laterality Date   BUNIONECTOMY  1994   right   cataracts Bilateral    CERVICAL FUSION  1991   COLONOSCOPY     x2   KNEE  ARTHROSCOPY  26,83,41   right kneex3   lefr arthroscopy     open x 2   PROSTATE BIOPSY     x 2   RADIOACTIVE SEED IMPLANT N/A 09/28/2019   Procedure: RADIOACTIVE SEED IMPLANT/BRACHYTHERAPY IMPLANT;  Surgeon: Franchot Gallo, MD;  Location: [place];  Service: Urology;  Laterality: N/A;  90 MINS   SHOULDER SURGERY Left 2008   SPACE OAR INSTILLATION N/A 09/28/2019   Procedure: SPACE OAR INSTILLATION;  Surgeon: Franchot Gallo, MD;  Location: [facility];  Service: Urology;  Laterality: N/A;   Family History  Problem Relation Age of Onset   Hypertension Mother    Diabetes Father    Cancer Neg Hx    Breast cancer Neg Hx    Prostate cancer Neg Hx    Colon cancer Neg Hx    Social History   Socioeconomic History   Marital status: Single    Spouse name: Not on file   Number of children: Not on file   Years of education: Not on file   Highest education level: Not on file  Occupational History   Occupation: retired in 2014  Tobacco Use   Smoking status: Never   Smokeless tobacco: Never  Vaping Use   Vaping Use: Never used  Substance and Sexual Activity   Alcohol use: Yes    Comment: daily 3 beers night   Drug use: No   Sexual activity:  Not Currently  Other Topics Concern   Not on file  Social History Narrative   Not on file   Social Determinants of Health   Financial Resource Strain: Low Risk    Difficulty of Paying Living Expenses: Not hard at all  Food Insecurity: No Food Insecurity   Worried About [place] in the Last Year: Never true   [place] in the Last Year: Never true  Transportation Needs: No Transportation Needs   Lack of Transportation (Medical): No   Lack of Transportation (Non-Medical): No  Physical Activity: Insufficiently Active   Days of Exercise per Week: 2 days   Minutes of Exercise per Session: 60 min  Stress: No Stress Concern Present   Feeling of Stress : Not at all  Social Connections:  Socially Isolated   Frequency of Communication with Friends and Family: Once a week   Frequency of Social Gatherings with Friends and Family: Once a week   Attends Religious Services: Never   Marine scientist or Organizations: No   Attends Archivist Meetings: Never   Marital Status: Living with partner   Clinical Intake: How often do you need to have someone help you when you read instructions, pamphlets, or other written materials from your doctor or pharmacy?: 1 - Never Diabetic? No  Interpreter Needed?: NoActivities of Daily Living In your present state of health, do you have any difficulty performing the following activities: 08/14/2021  Hearing? N  Vision? N  Difficulty concentrating or making decisions? N  Walking or climbing stairs? N  Dressing or bathing? N  Doing errands, shopping? N  Preparing Food and eating ? N  Using the Toilet? N  In the past six months, have you accidently leaked urine? N  Do you have problems with loss of bowel control? N  Managing your Medications? N  Managing your Finances? N  Housekeeping or managing your Housekeeping? N  Some recent data might be hidden    Patient Care Team: [name], MD as PCP - General (Family Medicine) Franchot Gallo, MD as Consulting Physician (Urology)  Indicate any recent Medical Services you may have received from other than [facility] providers in the past year (date may be approximate).     Assessment:   This is a routine wellness examination for [name].  Virtual Visit via Telephone Note  I connected with  [name] on 08/14/21 at  8:15 AM EST by telephone and verified that I am speaking with the correct person using two identifiers.  Location: Patient: Home Provider: Office Persons participating in the virtual visit: patient/Nurse Health Advisor   I discussed the limitations, risks, security and privacy concerns of performing an evaluation and management service by  telephone and the availability of in person appointments. The patient expressed understanding and agreed to proceed.  Interactive audio and video telecommunications were attempted between this nurse and patient, however failed, due to patient having technical difficulties OR patient did not have access to video capability.  We continued and completed visit with audio only.  Some vital signs may be absent or patient reported.   Criselda Peaches, LPN   Hearing/Vision screen Hearing Screening - Comments:: No Difficulty hearing Vision Screening - Comments:: No difficulty seeing.  Dietary issues and exercise activities discussed: Current Exercise Habits: Home exercise routine, Type of exercise: walking, Time (Minutes): 60, Frequency (Times/Week): 2, Weekly Exercise (Minutes/Week): 120, Intensity: Moderate   Goals Addressed  This Visit's Progress    Exercise 150 min/wk Moderate Activity       Maintain current exercise 150 min/wk Moderate Activity.       Depression Screen PHQ 2/9 Scores 08/14/2021 08/13/2020 08/04/2016 03/28/2015  PHQ - 2 Score 0 0 0 0  PHQ- 9 Score - 0 - -    Fall Risk Fall Risk  08/14/2021 08/13/2020 08/04/2016 03/28/2015  Falls in the past year? 0 0 No No  Number falls in past yr: 0 0 - -  Injury with Fall? 0 0 - -  Risk for fall due to : - No Fall Risks - -  Follow up - Falls evaluation completed;Falls prevention discussed - -    FALL RISK PREVENTION PERTAINING TO THE HOME:  Any stairs in or around the home? Yes  If so, are there any without handrails? No  Home free of loose throw rugs in walkways, pet beds, electrical cords, etc? Yes  Adequate lighting in your home to reduce risk of falls? Yes   ASSISTIVE DEVICES UTILIZED TO PREVENT FALLS:  Life alert? No  Use of a cane, walker or w/c? No  Grab bars in the bathroom? No  Shower chair or bench in shower? No  Elevated toilet seat or a handicapped toilet? No   TIMED UP AND GO:  Was the test  performed? No . Audio Visit.  Cognitive Function: MMSE - Mini Mental State Exam 08/04/2016  Not completed: (No Data)      Immunizations Immunization History  Administered Date(s) Administered   Fluad Quad(high Dose 65+) 05/29/2020, 05/21/2021   Influenza, High Dose Seasonal PF 08/23/2014, 07/30/2015, 08/04/2016   Influenza-Unspecified 08/23/2014, 07/09/2015, 05/28/2019   PFIZER(Purple Top)SARS-COV-2 Vaccination 05/21/2021   Pneumococcal Conjugate-13 09/27/2015   Pneumococcal Polysaccharide-23 04/10/2013   Td 02/14/2017   Tdap 04/11/2007   Zoster, Live 04/10/2013     Flu Vaccine status: Up to date  Pneumococcal vaccine status: Up to date  Covid-19 vaccine status: Completed vaccines  Qualifies for Shingles Vaccine? Yes   Zostavax completed No   Shingrix Completed?: No.    Education has been provided regarding the importance of this vaccine. Patient has been advised to call insurance company to determine out of pocket expense if they have not yet received this vaccine. Advised may also receive vaccine at local pharmacy or Health Dept. Verbalized acceptance and understanding.  Screening Tests Health Maintenance  Topic Date Due   COVID-19 Vaccine (4 - Booster for Pfizer series) 07/16/2021   Zoster Vaccines- Shingrix (1 of 2) 11/12/2021 (Originally 03/23/1965)   TETANUS/TDAP  02/15/2027   COLONOSCOPY (Pts 45-55yrs Insurance coverage will need to be confirmed)  08/16/2030   Pneumonia Vaccine 22+ Years old  Completed   INFLUENZA VACCINE  Completed   Hepatitis C Screening  Completed   HPV VACCINES  Aged Out    Health Maintenance  Health Maintenance Due  Topic Date Due   COVID-19 Vaccine (4 - Booster for [place] series) 07/16/2021    Additional Screening:   Vision Screening: Recommended annual ophthalmology exams for early detection of glaucoma and other disorders of the eye. Is the patient up to date with their annual eye exam?  Yes  Who is the provider or what is the  name of the office in which the patient attends annual eye exams? Followed by Dr Bing Plume    Dental Screening: Recommended annual dental exams for proper oral hygiene  Community Resource Referral / Chronic Care Management:  CRR required this visit?  No  CCM required this visit?  No      Plan:     I have personally reviewed and noted the following in the patient's chart:   Medical and social history Use of alcohol, tobacco or illicit drugs  Current medications and supplements including opioid prescriptions. Patient is not currently taking opioid prescriptions. Functional ability and status Nutritional status Physical activity Advanced directives List of other physicians Hospitalizations, surgeries, and ER visits in previous 12 months Vitals Screenings to include cognitive, depression, and falls Referrals and appointments  In addition, I have reviewed and discussed with patient certain preventive protocols, quality metrics, and best practice recommendations. A written personalized care plan for preventive services as well as general preventive health recommendations were provided to patient.     Criselda Peaches, LPN   [id number]

## 2021-08-14 NOTE — Patient Instructions (Addendum)
[name] , Thank you for taking time to come for your Medicare Wellness Visit. I appreciate your ongoing commitment to your health goals. Please review the following plan we discussed and let me know if I can assist you in the future.   These are the goals we discussed:  Goals      Exercise 150 min/wk Moderate Activity     Maintain current exercise 150 min/wk Moderate Activity.     patient     Goal is to relax, set up his telescope at home        This is a list of the screening recommended for you and due dates:  Health Maintenance  Topic Date Due   COVID-19 Vaccine (4 - Booster for Pfizer series) 07/16/2021   Zoster (Shingles) Vaccine (1 of 2) 11/12/2021*   Tetanus Vaccine  02/15/2027   Colon Cancer Screening  08/16/2030   Pneumonia Vaccine  Completed   Flu Shot  Completed   Hepatitis C Screening: USPSTF Recommendation to screen - Ages 73-79 yo.  Completed   HPV Vaccine  Aged Out  *Topic was postponed. The date shown is not the original due date.    Advanced directives: No  Conditions/risks identified: None  Next appointment: Follow up in one year for your annual wellness visit.   Preventive Care 42 Years and Older, Male Preventive care refers to lifestyle choices and visits with your health care provider that can promote health and wellness. What does preventive care include? A yearly physical exam. This is also called an annual well check. Dental exams once or twice a year. Routine eye exams. Ask your health care provider how often you should have your eyes checked. Personal lifestyle choices, including: Daily care of your teeth and gums. Regular physical activity. Eating a healthy diet. Avoiding tobacco and drug use. Limiting alcohol use. Practicing safe sex. Taking low doses of aspirin every day. Taking vitamin and mineral supplements as recommended by your health care provider. What happens during an annual well check? The services and screenings done by  your health care provider during your annual well check will depend on your age, overall health, lifestyle risk factors, and family history of disease. Counseling  Your health care provider may ask you questions about your: Alcohol use. Tobacco use. Drug use. Emotional well-being. Home and relationship well-being. Sexual activity. Eating habits. History of falls. Memory and ability to understand (cognition). Work and work Statistician. Screening  You may have the following tests or measurements: Height, weight, and BMI. Blood pressure. Lipid and cholesterol levels. These may be checked every 5 years, or more frequently if you are over 73 years old. Skin check. Lung cancer screening. You may have this screening every year starting at age 73 if you have a 30-pack-year history of smoking and currently smoke or have quit within the past 15 years. Fecal occult blood test (FOBT) of the stool. You may have this test every year starting at age 73. Flexible sigmoidoscopy or colonoscopy. You may have a sigmoidoscopy every 5 years or a colonoscopy every 10 years starting at age 73. Prostate cancer screening. Recommendations will vary depending on your family history and other risks. Hepatitis C blood test. Hepatitis B blood test. Sexually transmitted disease (STD) testing. Diabetes screening. This is done by checking your blood sugar (glucose) after you have not eaten for a while (fasting). You may have this done every 1-3 years. Abdominal aortic aneurysm (AAA) screening. You may need this if you are a current  or former smoker. Osteoporosis. You may be screened starting at age 73 if you are at high risk. Talk with your health care provider about your test results, treatment options, and if necessary, the need for more tests. Vaccines  Your health care provider may recommend certain vaccines, such as: Influenza vaccine. This is recommended every year. Tetanus, diphtheria, and acellular pertussis  (Tdap, Td) vaccine. You may need a Td booster every 10 years. Zoster vaccine. You may need this after age 73. Pneumococcal 13-valent conjugate (PCV13) vaccine. One dose is recommended after age 73. Pneumococcal polysaccharide (PPSV23) vaccine. One dose is recommended after age 73. Talk to your health care provider about which screenings and vaccines you need and how often you need them. This information is not intended to replace advice given to you by your health care provider. Make sure you discuss any questions you have with your health care provider. Document Released: 09/20/2015 Document Revised: 05/13/2016 Document Reviewed: 06/25/2015 Elsevier Interactive Patient Education  2017 [place] Prevention in the Home Falls can cause injuries. They can happen to people of all ages. There are many things you can do to make your home safe and to help prevent falls. What can I do on the outside of my home? Regularly fix the edges of walkways and driveways and fix any cracks. Remove anything that might make you trip as you walk through a door, such as a raised step or threshold. Trim any bushes or trees on the path to your home. Use bright outdoor lighting. Clear any walking paths of anything that might make someone trip, such as rocks or tools. Regularly check to see if handrails are loose or broken. Make sure that both sides of any steps have handrails. Any raised decks and porches should have guardrails on the edges. Have any leaves, snow, or ice cleared regularly. Use sand or salt on walking paths during winter. Clean up any spills in your garage right away. This includes oil or grease spills. What can I do in the bathroom? Use night lights. Install grab bars by the toilet and in the tub and shower. Do not use towel bars as grab bars. Use non-skid mats or decals in the tub or shower. If you need to sit down in the shower, use a plastic, non-slip stool. Keep the floor dry. Clean  up any water that spills on the floor as soon as it happens. Remove soap buildup in the tub or shower regularly. Attach bath mats securely with double-sided non-slip rug tape. Do not have throw rugs and other things on the floor that can make you trip. What can I do in the bedroom? Use night lights. Make sure that you have a light by your bed that is easy to reach. Do not use any sheets or blankets that are too big for your bed. They should not hang down onto the floor. Have a firm chair that has side arms. You can use this for support while you get dressed. Do not have throw rugs and other things on the floor that can make you trip. What can I do in the kitchen? Clean up any spills right away. Avoid walking on wet floors. Keep items that you use a lot in easy-to-reach places. If you need to reach something above you, use a strong step stool that has a grab bar. Keep electrical cords out of the way. Do not use floor polish or wax that makes floors slippery. If you must use wax,  use non-skid floor wax. Do not have throw rugs and other things on the floor that can make you trip. What can I do with my stairs? Do not leave any items on the stairs. Make sure that there are handrails on both sides of the stairs and use them. Fix handrails that are broken or loose. Make sure that handrails are as long as the stairways. Check any carpeting to make sure that it is firmly attached to the stairs. Fix any carpet that is loose or worn. Avoid having throw rugs at the top or bottom of the stairs. If you do have throw rugs, attach them to the floor with carpet tape. Make sure that you have a light switch at the top of the stairs and the bottom of the stairs. If you do not have them, ask someone to add them for you. What else can I do to help prevent falls? Wear shoes that: Do not have high heels. Have rubber bottoms. Are comfortable and fit you well. Are closed at the toe. Do not wear sandals. If you  use a stepladder: Make sure that it is fully opened. Do not climb a closed stepladder. Make sure that both sides of the stepladder are locked into place. Ask someone to hold it for you, if possible. Clearly mark and make sure that you can see: Any grab bars or handrails. First and last steps. Where the edge of each step is. Use tools that help you move around (mobility aids) if they are needed. These include: Canes. Walkers. Scooters. Crutches. Turn on the lights when you go into a dark area. Replace any light bulbs as soon as they burn out. Set up your furniture so you have a clear path. Avoid moving your furniture around. If any of your floors are uneven, fix them. If there are any pets around you, be aware of where they are. Review your medicines with your doctor. Some medicines can make you feel dizzy. This can increase your chance of falling. Ask your doctor what other things that you can do to help prevent falls. This information is not intended to replace advice given to you by your health care provider. Make sure you discuss any questions you have with your health care provider. Document Released: 06/20/2009 Document Revised: 01/30/2016 Document Reviewed: 09/28/2014 Elsevier Interactive Patient Education  2017 [employer].

## 2021-09-05 DIAGNOSIS — C61 Malignant neoplasm of prostate: Secondary | ICD-10-CM | POA: Diagnosis not present

## 2021-09-10 DIAGNOSIS — M25762 Osteophyte, left knee: Secondary | ICD-10-CM | POA: Diagnosis not present

## 2021-09-10 DIAGNOSIS — M2352 Chronic instability of knee, left knee: Secondary | ICD-10-CM | POA: Diagnosis not present

## 2021-09-10 DIAGNOSIS — M1712 Unilateral primary osteoarthritis, left knee: Secondary | ICD-10-CM | POA: Diagnosis not present

## 2021-09-10 DIAGNOSIS — M898X6 Other specified disorders of bone, lower leg: Secondary | ICD-10-CM | POA: Diagnosis not present

## 2021-09-24 DIAGNOSIS — C61 Malignant neoplasm of prostate: Secondary | ICD-10-CM | POA: Diagnosis not present

## 2021-09-29 DIAGNOSIS — M794 Hypertrophy of (infrapatellar) fat pad: Secondary | ICD-10-CM | POA: Diagnosis not present

## 2021-09-29 DIAGNOSIS — M94261 Chondromalacia, right knee: Secondary | ICD-10-CM | POA: Diagnosis not present

## 2021-09-29 DIAGNOSIS — M1712 Unilateral primary osteoarthritis, left knee: Secondary | ICD-10-CM | POA: Diagnosis not present

## 2021-09-29 DIAGNOSIS — I10 Essential (primary) hypertension: Secondary | ICD-10-CM | POA: Diagnosis not present

## 2021-10-03 DIAGNOSIS — Z96652 Presence of left artificial knee joint: Secondary | ICD-10-CM | POA: Diagnosis not present

## 2021-10-09 DIAGNOSIS — M1712 Unilateral primary osteoarthritis, left knee: Secondary | ICD-10-CM | POA: Diagnosis not present

## 2021-10-09 DIAGNOSIS — M94261 Chondromalacia, right knee: Secondary | ICD-10-CM | POA: Diagnosis not present

## 2021-10-09 DIAGNOSIS — I10 Essential (primary) hypertension: Secondary | ICD-10-CM | POA: Diagnosis not present

## 2021-10-09 DIAGNOSIS — M222X2 Patellofemoral disorders, left knee: Secondary | ICD-10-CM | POA: Diagnosis not present

## 2021-10-09 DIAGNOSIS — M794 Hypertrophy of (infrapatellar) fat pad: Secondary | ICD-10-CM | POA: Diagnosis not present

## 2021-10-09 DIAGNOSIS — G8918 Other acute postprocedural pain: Secondary | ICD-10-CM | POA: Diagnosis not present

## 2021-10-09 DIAGNOSIS — M659 Synovitis and tenosynovitis, unspecified: Secondary | ICD-10-CM | POA: Diagnosis not present

## 2021-10-09 DIAGNOSIS — M2242 Chondromalacia patellae, left knee: Secondary | ICD-10-CM | POA: Diagnosis not present

## 2021-10-10 DIAGNOSIS — Z9181 History of falling: Secondary | ICD-10-CM | POA: Diagnosis not present

## 2021-10-10 DIAGNOSIS — Z96652 Presence of left artificial knee joint: Secondary | ICD-10-CM | POA: Diagnosis not present

## 2021-10-10 DIAGNOSIS — Z471 Aftercare following joint replacement surgery: Secondary | ICD-10-CM | POA: Diagnosis not present

## 2021-10-10 DIAGNOSIS — Z7982 Long term (current) use of aspirin: Secondary | ICD-10-CM | POA: Diagnosis not present

## 2021-10-28 DIAGNOSIS — M542 Cervicalgia: Secondary | ICD-10-CM | POA: Diagnosis not present

## 2021-10-28 DIAGNOSIS — M25562 Pain in left knee: Secondary | ICD-10-CM | POA: Diagnosis not present

## 2021-10-28 DIAGNOSIS — M6281 Muscle weakness (generalized): Secondary | ICD-10-CM | POA: Diagnosis not present

## 2021-10-29 DIAGNOSIS — M25562 Pain in left knee: Secondary | ICD-10-CM | POA: Diagnosis not present

## 2021-10-29 DIAGNOSIS — M542 Cervicalgia: Secondary | ICD-10-CM | POA: Diagnosis not present

## 2021-10-29 DIAGNOSIS — M6281 Muscle weakness (generalized): Secondary | ICD-10-CM | POA: Diagnosis not present

## 2021-11-03 DIAGNOSIS — M25562 Pain in left knee: Secondary | ICD-10-CM | POA: Diagnosis not present

## 2021-11-03 DIAGNOSIS — M6281 Muscle weakness (generalized): Secondary | ICD-10-CM | POA: Diagnosis not present

## 2021-11-03 DIAGNOSIS — M542 Cervicalgia: Secondary | ICD-10-CM | POA: Diagnosis not present

## 2021-11-05 DIAGNOSIS — M25562 Pain in left knee: Secondary | ICD-10-CM | POA: Diagnosis not present

## 2021-11-05 DIAGNOSIS — M6281 Muscle weakness (generalized): Secondary | ICD-10-CM | POA: Diagnosis not present

## 2021-11-05 DIAGNOSIS — M542 Cervicalgia: Secondary | ICD-10-CM | POA: Diagnosis not present

## 2021-11-06 DIAGNOSIS — M25562 Pain in left knee: Secondary | ICD-10-CM | POA: Diagnosis not present

## 2021-11-06 DIAGNOSIS — M6281 Muscle weakness (generalized): Secondary | ICD-10-CM | POA: Diagnosis not present

## 2021-11-06 DIAGNOSIS — M542 Cervicalgia: Secondary | ICD-10-CM | POA: Diagnosis not present

## 2021-11-10 DIAGNOSIS — M542 Cervicalgia: Secondary | ICD-10-CM | POA: Diagnosis not present

## 2021-11-10 DIAGNOSIS — M25562 Pain in left knee: Secondary | ICD-10-CM | POA: Diagnosis not present

## 2021-11-10 DIAGNOSIS — M6281 Muscle weakness (generalized): Secondary | ICD-10-CM | POA: Diagnosis not present

## 2021-11-13 DIAGNOSIS — M25562 Pain in left knee: Secondary | ICD-10-CM | POA: Diagnosis not present

## 2021-11-13 DIAGNOSIS — M542 Cervicalgia: Secondary | ICD-10-CM | POA: Diagnosis not present

## 2021-11-13 DIAGNOSIS — M6281 Muscle weakness (generalized): Secondary | ICD-10-CM | POA: Diagnosis not present

## 2021-11-14 DIAGNOSIS — M25562 Pain in left knee: Secondary | ICD-10-CM | POA: Diagnosis not present

## 2021-11-14 DIAGNOSIS — M6281 Muscle weakness (generalized): Secondary | ICD-10-CM | POA: Diagnosis not present

## 2021-11-14 DIAGNOSIS — M542 Cervicalgia: Secondary | ICD-10-CM | POA: Diagnosis not present

## 2021-11-17 DIAGNOSIS — M6281 Muscle weakness (generalized): Secondary | ICD-10-CM | POA: Diagnosis not present

## 2021-11-17 DIAGNOSIS — M542 Cervicalgia: Secondary | ICD-10-CM | POA: Diagnosis not present

## 2021-11-17 DIAGNOSIS — M25562 Pain in left knee: Secondary | ICD-10-CM | POA: Diagnosis not present

## 2021-11-19 DIAGNOSIS — M542 Cervicalgia: Secondary | ICD-10-CM | POA: Diagnosis not present

## 2021-11-19 DIAGNOSIS — M25562 Pain in left knee: Secondary | ICD-10-CM | POA: Diagnosis not present

## 2021-11-19 DIAGNOSIS — M6281 Muscle weakness (generalized): Secondary | ICD-10-CM | POA: Diagnosis not present

## 2021-11-21 DIAGNOSIS — M25562 Pain in left knee: Secondary | ICD-10-CM | POA: Diagnosis not present

## 2021-11-21 DIAGNOSIS — M542 Cervicalgia: Secondary | ICD-10-CM | POA: Diagnosis not present

## 2021-11-21 DIAGNOSIS — M6281 Muscle weakness (generalized): Secondary | ICD-10-CM | POA: Diagnosis not present

## 2021-11-24 DIAGNOSIS — M25562 Pain in left knee: Secondary | ICD-10-CM | POA: Diagnosis not present

## 2021-11-24 DIAGNOSIS — M6281 Muscle weakness (generalized): Secondary | ICD-10-CM | POA: Diagnosis not present

## 2021-11-24 DIAGNOSIS — M542 Cervicalgia: Secondary | ICD-10-CM | POA: Diagnosis not present

## 2021-11-26 DIAGNOSIS — Z96652 Presence of left artificial knee joint: Secondary | ICD-10-CM | POA: Diagnosis not present

## 2021-11-27 DIAGNOSIS — M542 Cervicalgia: Secondary | ICD-10-CM | POA: Diagnosis not present

## 2021-11-27 DIAGNOSIS — M25562 Pain in left knee: Secondary | ICD-10-CM | POA: Diagnosis not present

## 2021-11-27 DIAGNOSIS — M6281 Muscle weakness (generalized): Secondary | ICD-10-CM | POA: Diagnosis not present

## 2021-11-28 DIAGNOSIS — M25562 Pain in left knee: Secondary | ICD-10-CM | POA: Diagnosis not present

## 2021-11-28 DIAGNOSIS — M542 Cervicalgia: Secondary | ICD-10-CM | POA: Diagnosis not present

## 2021-11-28 DIAGNOSIS — M6281 Muscle weakness (generalized): Secondary | ICD-10-CM | POA: Diagnosis not present

## 2021-12-01 DIAGNOSIS — M25562 Pain in left knee: Secondary | ICD-10-CM | POA: Diagnosis not present

## 2021-12-01 DIAGNOSIS — M542 Cervicalgia: Secondary | ICD-10-CM | POA: Diagnosis not present

## 2021-12-01 DIAGNOSIS — M6281 Muscle weakness (generalized): Secondary | ICD-10-CM | POA: Diagnosis not present

## 2021-12-03 DIAGNOSIS — M6281 Muscle weakness (generalized): Secondary | ICD-10-CM | POA: Diagnosis not present

## 2021-12-03 DIAGNOSIS — M542 Cervicalgia: Secondary | ICD-10-CM | POA: Diagnosis not present

## 2021-12-03 DIAGNOSIS — C61 Malignant neoplasm of prostate: Secondary | ICD-10-CM | POA: Diagnosis not present

## 2021-12-03 DIAGNOSIS — M25562 Pain in left knee: Secondary | ICD-10-CM | POA: Diagnosis not present

## 2021-12-05 DIAGNOSIS — M6281 Muscle weakness (generalized): Secondary | ICD-10-CM | POA: Diagnosis not present

## 2021-12-05 DIAGNOSIS — M542 Cervicalgia: Secondary | ICD-10-CM | POA: Diagnosis not present

## 2021-12-05 DIAGNOSIS — M25562 Pain in left knee: Secondary | ICD-10-CM | POA: Diagnosis not present

## 2021-12-08 DIAGNOSIS — M6281 Muscle weakness (generalized): Secondary | ICD-10-CM | POA: Diagnosis not present

## 2021-12-08 DIAGNOSIS — M542 Cervicalgia: Secondary | ICD-10-CM | POA: Diagnosis not present

## 2021-12-08 DIAGNOSIS — M25562 Pain in left knee: Secondary | ICD-10-CM | POA: Diagnosis not present

## 2021-12-10 DIAGNOSIS — M542 Cervicalgia: Secondary | ICD-10-CM | POA: Diagnosis not present

## 2021-12-10 DIAGNOSIS — M6281 Muscle weakness (generalized): Secondary | ICD-10-CM | POA: Diagnosis not present

## 2021-12-10 DIAGNOSIS — M25562 Pain in left knee: Secondary | ICD-10-CM | POA: Diagnosis not present

## 2021-12-12 DIAGNOSIS — M6281 Muscle weakness (generalized): Secondary | ICD-10-CM | POA: Diagnosis not present

## 2021-12-12 DIAGNOSIS — M542 Cervicalgia: Secondary | ICD-10-CM | POA: Diagnosis not present

## 2021-12-12 DIAGNOSIS — M25562 Pain in left knee: Secondary | ICD-10-CM | POA: Diagnosis not present

## 2021-12-15 DIAGNOSIS — M542 Cervicalgia: Secondary | ICD-10-CM | POA: Diagnosis not present

## 2021-12-15 DIAGNOSIS — M25562 Pain in left knee: Secondary | ICD-10-CM | POA: Diagnosis not present

## 2021-12-15 DIAGNOSIS — M6281 Muscle weakness (generalized): Secondary | ICD-10-CM | POA: Diagnosis not present

## 2021-12-17 DIAGNOSIS — M6281 Muscle weakness (generalized): Secondary | ICD-10-CM | POA: Diagnosis not present

## 2021-12-17 DIAGNOSIS — M25562 Pain in left knee: Secondary | ICD-10-CM | POA: Diagnosis not present

## 2021-12-17 DIAGNOSIS — M542 Cervicalgia: Secondary | ICD-10-CM | POA: Diagnosis not present

## 2021-12-19 DIAGNOSIS — M6281 Muscle weakness (generalized): Secondary | ICD-10-CM | POA: Diagnosis not present

## 2021-12-19 DIAGNOSIS — M542 Cervicalgia: Secondary | ICD-10-CM | POA: Diagnosis not present

## 2021-12-19 DIAGNOSIS — M25562 Pain in left knee: Secondary | ICD-10-CM | POA: Diagnosis not present

## 2021-12-24 DIAGNOSIS — N400 Enlarged prostate without lower urinary tract symptoms: Secondary | ICD-10-CM | POA: Diagnosis not present

## 2021-12-24 DIAGNOSIS — C61 Malignant neoplasm of prostate: Secondary | ICD-10-CM | POA: Diagnosis not present

## 2022-01-28 DIAGNOSIS — Z96652 Presence of left artificial knee joint: Secondary | ICD-10-CM | POA: Diagnosis not present

## 2022-02-04 ENCOUNTER — Other Ambulatory Visit: Payer: Self-pay | Admitting: Family Medicine

## 2022-03-23 DIAGNOSIS — C61 Malignant neoplasm of prostate: Secondary | ICD-10-CM | POA: Diagnosis not present

## 2022-05-05 ENCOUNTER — Other Ambulatory Visit: Payer: Self-pay | Admitting: Family Medicine

## 2022-05-15 DIAGNOSIS — M542 Cervicalgia: Secondary | ICD-10-CM | POA: Diagnosis not present

## 2022-05-15 DIAGNOSIS — M47814 Spondylosis without myelopathy or radiculopathy, thoracic region: Secondary | ICD-10-CM | POA: Diagnosis not present

## 2022-06-10 DIAGNOSIS — M47812 Spondylosis without myelopathy or radiculopathy, cervical region: Secondary | ICD-10-CM | POA: Diagnosis not present

## 2022-06-11 DIAGNOSIS — C61 Malignant neoplasm of prostate: Secondary | ICD-10-CM | POA: Diagnosis not present

## 2022-06-17 ENCOUNTER — Encounter: Payer: Self-pay | Admitting: Family Medicine

## 2022-06-17 ENCOUNTER — Ambulatory Visit (INDEPENDENT_AMBULATORY_CARE_PROVIDER_SITE_OTHER): Payer: Medicare Other | Admitting: Family Medicine

## 2022-06-17 VITALS — BP 120/74 | HR 76 | Temp 98.0°F | Ht 72.0 in | Wt 183.8 lb

## 2022-06-17 DIAGNOSIS — N400 Enlarged prostate without lower urinary tract symptoms: Secondary | ICD-10-CM | POA: Diagnosis not present

## 2022-06-17 DIAGNOSIS — R5383 Other fatigue: Secondary | ICD-10-CM | POA: Diagnosis not present

## 2022-06-17 DIAGNOSIS — I10 Essential (primary) hypertension: Secondary | ICD-10-CM | POA: Diagnosis not present

## 2022-06-17 DIAGNOSIS — C61 Malignant neoplasm of prostate: Secondary | ICD-10-CM | POA: Diagnosis not present

## 2022-06-17 MED ORDER — LISINOPRIL 5 MG PO TABS: 5.0000 mg | ORAL_TABLET | Freq: Every day | ORAL | 3 refills | Status: DC

## 2022-06-17 NOTE — Patient Instructions (Signed)
Reduce the Lisinopril to 5 mg daily.

## 2022-06-17 NOTE — Progress Notes (Signed)
Established Patient Office Visit  Subjective   Patient ID: Alan [facility], male    DOB: 02/03/1946  Age: 76 y.o. MRN: [id number]  Chief Complaint  Patient presents with   Fatigue    Patient complains of fatigue, x2 weeks     HPI   Increased fatigue past couple weeks.  He has history of osteoarthritis and had a left total knee replacement done last February.  Has recovered fairly well since then.  Stays very active.  He brings in a log of blood pressure readings and has had several low readings with some as low as 97/78.  Most of his readings have been around 546 systolic.  Currently on lisinopril 10 mg daily and amlodipine 5 mg daily.  Denies any chest pains.  No consistent orthostatic symptoms.  General fatigue.  Appetite fair but slightly diminished.  He does recall having COVID back in September and was treated with Paxlovid.  Current symptoms though seem to worsen a couple weeks ago.  No chest pressure.  Sleeping fairly well.  He has prostate cancer followed by urology.  Recent PSAs reportedly stable.  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   Shoulder impingement syndrome, left 07/01/2012   Past Surgical History:  Procedure Laterality Date   BUNIONECTOMY  1994   right   cataracts Bilateral    CERVICAL FUSION  1991   COLONOSCOPY     x2   KNEE ARTHROSCOPY  27,03,50   right kneex3   lefr arthroscopy     open x 2   PROSTATE BIOPSY     x 2   RADIOACTIVE SEED IMPLANT N/A 09/28/2019   Procedure: RADIOACTIVE SEED IMPLANT/BRACHYTHERAPY IMPLANT;  Surgeon: Alan Gallo, MD;  Location: [facility];  Service: Urology;  Laterality: N/A;  90 MINS   SHOULDER SURGERY Left 2008   SPACE OAR INSTILLATION N/A 09/28/2019   Procedure: SPACE OAR INSTILLATION;  Surgeon: Alan Gallo, MD;  Location: [facility];  Service: Urology;  Laterality: N/A;    reports that he has never smoked. He has never used smokeless tobacco. He reports current alcohol use. He reports that he does not use drugs. family history includes Diabetes in his father; Hypertension in his mother. No Known Allergies  Review of Systems  Constitutional:  Positive for malaise/fatigue. Negative for chills, fever and weight loss.  Respiratory:  Negative for cough, shortness of breath and wheezing.   Cardiovascular:  Negative for chest pain and leg swelling.  Gastrointestinal:  Negative for abdominal pain.  Neurological:  Negative for dizziness.      Objective:     BP 120/74 (BP Location: Left Arm, Patient Position: Sitting, Cuff Size: Normal)   Pulse 76   Temp 98 F (36.7 C) (Oral)   Ht 6' (1.829 m)   Wt 183 lb 12.8 oz (83.4 kg)   SpO2 99%   BMI 24.93 kg/m  BP Readings from Last 3 Encounters:  06/17/22 120/74  04/21/21 140/90  05/29/20 140/90   Wt Readings from Last 3 Encounters:  06/17/22 183 lb 12.8 oz (83.4 kg)  08/14/21  184 lb (83.5 kg)  04/21/21 184 lb 6.4 oz (83.6 kg)      Physical Exam Vitals reviewed.  Constitutional:      Appearance: He is well-developed.  HENT:     Right Ear: External ear normal.     Left Ear: External ear normal.  Eyes:     Pupils: Pupils are equal, round, and reactive to light.  Neck:     Thyroid: No thyromegaly.  Cardiovascular:     Rate and Rhythm: Normal rate and regular rhythm.  Pulmonary:     Effort: Pulmonary effort is normal. No respiratory distress.     Breath sounds: Normal breath sounds. No wheezing or rales.  Musculoskeletal:     Cervical back: Neck supple.     Right lower leg: No edema.     Left lower leg: No edema.   Neurological:     Mental Status: He is alert and oriented to person, place, and time.      No results found for any visits on 06/17/22.    The 10-year ASCVD risk score (Arnett DK, et al., 2019) is: 24.1%    Assessment & Plan:   #1 fatigue.  Etiology unclear.  He does bring in log of several low blood pressures at home and may be overtreated there.  We did discuss reducing his blood pressure medication as below. -Check further labs with CBC, TSH, CMP -Watch closely for any new more specific symptoms  #2 hypertension possibly overtreated with several recent low home readings.  Stay well-hydrated.  Reduce lisinopril to 5 mg daily and continue amlodipine 5 mg daily.   No follow-ups on file.    Carolann Littler, MD

## 2022-06-18 LAB — COMPREHENSIVE METABOLIC PANEL
ALT: 27 U/L (ref 0–53)
AST: 25 U/L (ref 0–37)
Albumin: 3.9 g/dL (ref 3.5–5.2)
Alkaline Phosphatase: 73 U/L (ref 39–117)
BUN: 19 mg/dL (ref 6–23)
CO2: 27 mEq/L (ref 19–32)
Calcium: 9 mg/dL (ref 8.4–10.5)
Chloride: 105 mEq/L (ref 96–112)
Creatinine, Ser: 1.23 mg/dL (ref 0.40–1.50)
GFR: 57.94 mL/min — ABNORMAL LOW (ref >60.00)
Glucose, Bld: 93 mg/dL (ref 70–99)
Potassium: 3.8 mEq/L (ref 3.5–5.1)
Sodium: 140 mEq/L (ref 135–145)
Total Bilirubin: 0.6 mg/dL (ref 0.2–1.2)
Total Protein: 7.1 g/dL (ref 6.0–8.3)

## 2022-06-18 LAB — CBC WITH DIFFERENTIAL/PLATELET
Basophils Absolute: 0.1 10*3/uL (ref 0.0–0.1)
Basophils Relative: 1.1 % (ref 0.0–3.0)
Eosinophils Absolute: 0.1 10*3/uL (ref 0.0–0.7)
Eosinophils Relative: 1.6 % (ref 0.0–5.0)
HCT: 42.1 % (ref 39.0–52.0)
Hemoglobin: 14.4 g/dL (ref 13.0–17.0)
Lymphocytes Relative: 21.4 % (ref 12.0–46.0)
Lymphs Abs: 1.4 10*3/uL (ref 0.7–4.0)
MCHC: 34.3 g/dL (ref 30.0–36.0)
MCV: 94.1 fl (ref 78.0–100.0)
Monocytes Absolute: 0.5 10*3/uL (ref 0.1–1.0)
Monocytes Relative: 7.4 % (ref 3.0–12.0)
Neutro Abs: 4.3 10*3/uL (ref 1.4–7.7)
Neutrophils Relative %: 68.5 % (ref 43.0–77.0)
Platelets: 254 10*3/uL (ref 150.0–400.0)
RBC: 4.47 Mil/uL (ref 4.22–5.81)
RDW: 13 % (ref 11.5–15.5)
WBC: 6.3 10*3/uL (ref 4.0–10.5)

## 2022-06-18 LAB — TSH: TSH: 1.15 u[IU]/mL (ref 0.35–5.50)

## 2022-07-20 DIAGNOSIS — H43813 Vitreous degeneration, bilateral: Secondary | ICD-10-CM | POA: Diagnosis not present

## 2022-07-20 DIAGNOSIS — H5213 Myopia, bilateral: Secondary | ICD-10-CM | POA: Diagnosis not present

## 2022-07-20 DIAGNOSIS — Z961 Presence of intraocular lens: Secondary | ICD-10-CM | POA: Diagnosis not present

## 2022-07-20 DIAGNOSIS — H35363 Drusen (degenerative) of macula, bilateral: Secondary | ICD-10-CM | POA: Diagnosis not present

## 2022-08-17 ENCOUNTER — Telehealth: Payer: Self-pay

## 2022-08-17 NOTE — Telephone Encounter (Signed)
Unsuccessful attempt to reach patient on preferred number listed in notes for scheduled AWV. Left message on voicemail okay to reschedule. 

## 2022-08-19 ENCOUNTER — Telehealth: Payer: Self-pay | Admitting: Family Medicine

## 2022-08-19 NOTE — Telephone Encounter (Signed)
Left message for patient to call back and schedule Medicare Annual Wellness Visit (AWV) either virtually or in office. Left  my Alan [facility] number [phone]   Last AWV 08/14/21 please schedule with Nurse Health Adviser   45 min for awv-i and in office appointments 30 min for awv-s  phone/virtual appointments

## 2022-09-21 DIAGNOSIS — C61 Malignant neoplasm of prostate: Secondary | ICD-10-CM | POA: Diagnosis not present

## 2022-11-10 DIAGNOSIS — S0501XA Injury of conjunctiva and corneal abrasion without foreign body, right eye, initial encounter: Secondary | ICD-10-CM | POA: Diagnosis not present

## 2022-12-11 DIAGNOSIS — C61 Malignant neoplasm of prostate: Secondary | ICD-10-CM | POA: Diagnosis not present

## 2022-12-18 DIAGNOSIS — C61 Malignant neoplasm of prostate: Secondary | ICD-10-CM | POA: Diagnosis not present

## 2022-12-18 DIAGNOSIS — N400 Enlarged prostate without lower urinary tract symptoms: Secondary | ICD-10-CM | POA: Diagnosis not present

## 2023-05-28 DIAGNOSIS — C61 Malignant neoplasm of prostate: Secondary | ICD-10-CM | POA: Diagnosis not present

## 2023-07-02 ENCOUNTER — Ambulatory Visit (INDEPENDENT_AMBULATORY_CARE_PROVIDER_SITE_OTHER): Payer: Medicare Other | Admitting: Family Medicine

## 2023-07-02 VITALS — BP 150/80 | HR 65 | Temp 97.7°F | Ht 72.0 in | Wt 180.0 lb

## 2023-07-02 DIAGNOSIS — C61 Malignant neoplasm of prostate: Secondary | ICD-10-CM | POA: Diagnosis not present

## 2023-07-02 DIAGNOSIS — E7800 Pure hypercholesterolemia, unspecified: Secondary | ICD-10-CM

## 2023-07-02 DIAGNOSIS — E785 Hyperlipidemia, unspecified: Secondary | ICD-10-CM | POA: Diagnosis not present

## 2023-07-02 DIAGNOSIS — I10 Essential (primary) hypertension: Secondary | ICD-10-CM

## 2023-07-02 DIAGNOSIS — I7121 Aneurysm of the ascending aorta, without rupture: Secondary | ICD-10-CM | POA: Diagnosis not present

## 2023-07-02 DIAGNOSIS — N400 Enlarged prostate without lower urinary tract symptoms: Secondary | ICD-10-CM | POA: Diagnosis not present

## 2023-07-02 LAB — COMPREHENSIVE METABOLIC PANEL
ALT: 21 U/L (ref 0–53)
AST: 21 U/L (ref 0–37)
Albumin: 4.2 g/dL (ref 3.5–5.2)
Alkaline Phosphatase: 71 U/L (ref 39–117)
BUN: 19 mg/dL (ref 6–23)
CO2: 28 meq/L (ref 19–32)
Calcium: 9.1 mg/dL (ref 8.4–10.5)
Chloride: 105 meq/L (ref 96–112)
Creatinine, Ser: 0.96 mg/dL (ref 0.40–1.50)
GFR: 77.45 mL/min (ref >60.00)
Glucose, Bld: 110 mg/dL — ABNORMAL HIGH (ref 70–99)
Potassium: 3.9 meq/L (ref 3.5–5.1)
Sodium: 140 meq/L (ref 135–145)
Total Bilirubin: 0.8 mg/dL (ref 0.2–1.2)
Total Protein: 7.1 g/dL (ref 6.0–8.3)

## 2023-07-02 LAB — LIPID PANEL
Cholesterol: 202 mg/dL — ABNORMAL HIGH (ref 0–200)
HDL: 64.8 mg/dL (ref >39.00)
LDL Cholesterol: 121 mg/dL — ABNORMAL HIGH (ref 0–99)
NonHDL: 136.84
Total CHOL/HDL Ratio: 3
Triglycerides: 78 mg/dL (ref 0.0–149.0)
VLDL: 15.6 mg/dL (ref 0.0–40.0)

## 2023-07-02 MED ORDER — AMLODIPINE BESYLATE 2.5 MG PO TABS: 2.5000 mg | ORAL_TABLET | Freq: Every day | ORAL | 3 refills | Status: DC

## 2023-07-02 NOTE — Progress Notes (Signed)
Established Patient Office Visit  Subjective   Patient ID: Alan [facility]ges, male    DOB: [birth year]  Age: 77 y.o. MRN: [id number]  No chief complaint on file.   HPI   Alan Hodges is seen for medical follow-up.  He has history of hypertension, prostate cancer, osteoarthritis involving both knees.  He saw urologist back in 2022 and had PET scan which showed 4.1 cm ascending aortic aneurysm with recommended annual follow-up.  No recent chest pains.  Non-smoker.  History of hypertension.  At 1 point he was taking lisinopril 5 mg daily and amlodipine 5 mg daily.  He had some hypotensive issues and came off both medications and has basically been off all blood pressure medications for several months.  Brings in log of home readings which are usually higher in the mornings usually 130s to 140 systolic and 80-90 diastolic and p.m. readings usually 102-125 over 60s and 70s diastolic.  Stays quite active.  No longer able to run or cycle because of his knees but does some walking.  Last LDL cholesterol 123.  He has questions today regarding statins.  No premature CAD history.  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   Shoulder impingement syndrome, left 07/01/2012   Past Surgical History:  Procedure Laterality Date   BUNIONECTOMY  1994   right   cataracts Bilateral    CERVICAL FUSION  1991   COLONOSCOPY     x2   KNEE ARTHROSCOPY  46,96,29   right kneex3   lefr arthroscopy     open x 2   PROSTATE BIOPSY     x 2   RADIOACTIVE SEED IMPLANT N/A 09/28/2019   Procedure: RADIOACTIVE SEED IMPLANT/BRACHYTHERAPY IMPLANT;   Surgeon: Alan Matar, MD;  Location: [facility];  Service: Urology;  Laterality: N/A;  90 MINS   SHOULDER SURGERY Left 2008   SPACE OAR INSTILLATION N/A 09/28/2019   Procedure: SPACE OAR INSTILLATION;  Surgeon: Alan Matar, MD;  Location: [facility];  Service: Urology;  Laterality: N/A;    reports that he has never smoked. He has never used smokeless tobacco. He reports current alcohol use. He reports that he does not use drugs. family history includes Diabetes in his father; Hypertension in his mother. No Known Allergies  Review of Systems  Constitutional:  Negative for malaise/fatigue.  Eyes:  Negative for blurred vision.  Respiratory:  Negative for shortness of breath.   Cardiovascular:  Negative for chest pain.  Neurological:  Negative for dizziness, weakness and headaches.      Objective:     BP (!) 150/80 (BP Location: Left Arm, Patient Position: Sitting, Cuff Size: Normal)   Pulse 65   Temp 97.7 F (36.5 C) (Oral)   Ht 6' (1.829 m)   Wt 180 lb (81.6 kg)   SpO2 99%   BMI 24.41 kg/m  BP Readings from Last 3 Encounters:  07/02/23 (!) 150/80  06/17/22 120/74  04/21/21 140/90   Wt Readings from Last 3 Encounters:  07/02/23 180 lb (81.6 kg)  06/17/22 183 lb 12.8 oz (83.4 kg)  08/14/21  184 lb (83.5 kg)      Physical Exam Vitals reviewed.  Constitutional:      Appearance: He is well-developed.  Eyes:     Pupils: Pupils are equal, round, and reactive to light.  Neck:     Thyroid: No thyromegaly.  Cardiovascular:     Rate and Rhythm: Normal rate and regular rhythm.  Pulmonary:     Effort: Pulmonary effort is normal. No respiratory distress.     Breath sounds: Normal breath sounds. No wheezing or rales.  Musculoskeletal:     Cervical back: Neck supple.     Right lower leg: No edema.     Left lower leg: No edema.  Neurological:     Mental Status: He is alert and oriented to person, place, and time.      No  results found for any visits on 07/02/23.  Last CBC Lab Results  Component Value Date   WBC 6.3 06/17/2022   HGB 14.4 06/17/2022   HCT 42.1 06/17/2022   MCV 94.1 06/17/2022   MCH 32.6 05/29/2020   RDW 13.0 06/17/2022   PLT 254.0 06/17/2022   Last metabolic panel Lab Results  Component Value Date   GLUCOSE 93 06/17/2022   NA 140 06/17/2022   K 3.8 06/17/2022   CL 105 06/17/2022   CO2 27 06/17/2022   BUN 19 06/17/2022   CREATININE 1.23 06/17/2022   GFR 57.94 (L) 06/17/2022   CALCIUM 9.0 06/17/2022   PROT 7.1 06/17/2022   ALBUMIN 3.9 06/17/2022   BILITOT 0.6 06/17/2022   ALKPHOS 73 06/17/2022   AST 25 06/17/2022   ALT 27 06/17/2022   ANIONGAP 7 09/25/2019   Last lipids Lab Results  Component Value Date   CHOL 197 04/21/2021   HDL 50.10 04/21/2021   LDLCALC 123 (H) 04/21/2021   LDLDIRECT 140.3 04/10/2013   TRIG 118.0 04/21/2021   CHOLHDL 4 04/21/2021      The 57-QION ASCVD risk score (Arnett DK, et al., 2019) is: 35.7%    Assessment & Plan:   #1 hypertension.  Currently not treated with medication.  Initial blood pressure here today 150/80 and repeat left arm seated after rest 162/90. -Recommend starting back amlodipine 2.5 mg daily.  He has been sensitive to higher doses in the past. -Keep dietary sodium intake down -Monitor blood pressure closely and be in touch with readings over the next few weeks  #2 ascending aortic aneurysm noted on PET scan 2 years ago.  4.1 cm.  Needs follow-up.  Would get either MRA or CTA.  He is currently living outside of [place] in [facility] I will get back with [place] regarding imaging location so we can order  #3 history of mild hyperlipidemia.  No family history of premature CAD.  Mention of aortic atherosclerosis on previous imaging.  We did talk about statin use.  He would like to get follow-up fasting lipids today first and go from there.   No follow-ups on file.    Alan Peat, MD

## 2023-07-02 NOTE — Patient Instructions (Signed)
Let's plan on follow up X-ray MRA or CTA of chest to reassess the ascending aneurysm  We will call with labs- and consider statin after labs back

## 2023-07-05 ENCOUNTER — Telehealth: Payer: Self-pay | Admitting: Family Medicine

## 2023-07-05 DIAGNOSIS — I7121 Aneurysm of the ascending aorta, without rupture: Secondary | ICD-10-CM

## 2023-07-05 MED ORDER — ROSUVASTATIN CALCIUM 10 MG PO TABS: 10.0000 mg | ORAL_TABLET | Freq: Every day | ORAL | 0 refills | Status: DC

## 2023-07-05 NOTE — Addendum Note (Signed)
Addended by: Laneta Simmers L on: 07/05/2023 11:11 AM   Modules accepted: Orders

## 2023-07-05 NOTE — Addendum Note (Signed)
Addended by: Kristian Covey on: 07/05/2023 01:22 PM   Modules accepted: Orders

## 2023-07-05 NOTE — Telephone Encounter (Signed)
Patient informed of the message below and voiced understanding  

## 2023-07-05 NOTE — Telephone Encounter (Signed)
Pt calling back, with info on where he would like to go for his MRI in [place] [address] [place] phone [phone]

## 2023-07-23 ENCOUNTER — Other Ambulatory Visit: Payer: Self-pay | Admitting: Family Medicine

## 2023-07-23 DIAGNOSIS — I7121 Aneurysm of the ascending aorta, without rupture: Secondary | ICD-10-CM

## 2023-07-26 DIAGNOSIS — H35363 Drusen (degenerative) of macula, bilateral: Secondary | ICD-10-CM | POA: Diagnosis not present

## 2023-07-26 DIAGNOSIS — Z961 Presence of intraocular lens: Secondary | ICD-10-CM | POA: Diagnosis not present

## 2023-07-26 DIAGNOSIS — H43813 Vitreous degeneration, bilateral: Secondary | ICD-10-CM | POA: Diagnosis not present

## 2023-07-26 DIAGNOSIS — H35373 Puckering of macula, bilateral: Secondary | ICD-10-CM | POA: Diagnosis not present

## 2023-07-26 DIAGNOSIS — Z83518 Family history of other specified eye disorder: Secondary | ICD-10-CM | POA: Diagnosis not present

## 2023-07-29 ENCOUNTER — Telehealth: Payer: Self-pay | Admitting: Family Medicine

## 2023-07-29 NOTE — Telephone Encounter (Signed)
Pt is calling and there is error with MR angiogram and per pt  mission imaging will refax for correction their phone number is [phone]

## 2023-08-27 DIAGNOSIS — K635 Polyp of colon: Secondary | ICD-10-CM | POA: Diagnosis not present

## 2023-08-27 DIAGNOSIS — Z8546 Personal history of malignant neoplasm of prostate: Secondary | ICD-10-CM | POA: Diagnosis not present

## 2023-08-27 DIAGNOSIS — Z860101 Personal history of adenomatous and serrated colon polyps: Secondary | ICD-10-CM | POA: Diagnosis not present

## 2023-08-27 DIAGNOSIS — Z09 Encounter for follow-up examination after completed treatment for conditions other than malignant neoplasm: Secondary | ICD-10-CM | POA: Diagnosis not present

## 2023-08-27 LAB — HM COLONOSCOPY

## 2023-09-22 ENCOUNTER — Encounter: Payer: Self-pay | Admitting: Family Medicine

## 2023-09-24 ENCOUNTER — Encounter: Payer: Self-pay | Admitting: Family Medicine

## 2023-09-24 ENCOUNTER — Ambulatory Visit (INDEPENDENT_AMBULATORY_CARE_PROVIDER_SITE_OTHER): Payer: Medicare Other | Admitting: Family Medicine

## 2023-09-24 VITALS — BP 140/80 | HR 65 | Temp 97.5°F | Wt 183.5 lb

## 2023-09-24 DIAGNOSIS — I10 Essential (primary) hypertension: Secondary | ICD-10-CM | POA: Diagnosis not present

## 2023-09-24 DIAGNOSIS — I7121 Aneurysm of the ascending aorta, without rupture: Secondary | ICD-10-CM

## 2023-09-24 DIAGNOSIS — E7800 Pure hypercholesterolemia, unspecified: Secondary | ICD-10-CM

## 2023-09-24 LAB — HEPATIC FUNCTION PANEL
ALT: 34 U/L (ref 0–53)
AST: 27 U/L (ref 0–37)
Albumin: 4.3 g/dL (ref 3.5–5.2)
Alkaline Phosphatase: 75 U/L (ref 39–117)
Bilirubin, Direct: 0.2 mg/dL (ref 0.0–0.3)
Total Bilirubin: 0.8 mg/dL (ref 0.2–1.2)
Total Protein: 7.2 g/dL (ref 6.0–8.3)

## 2023-09-24 LAB — LIPID PANEL
Cholesterol: 142 mg/dL (ref 0–200)
HDL: 55.7 mg/dL (ref >39.00)
LDL Cholesterol: 64 mg/dL (ref 0–99)
NonHDL: 85.95
Total CHOL/HDL Ratio: 3
Triglycerides: 108 mg/dL (ref 0.0–149.0)
VLDL: 21.6 mg/dL (ref 0.0–40.0)

## 2023-09-24 MED ORDER — AMLODIPINE BESYLATE 5 MG PO TABS: 5.0000 mg | ORAL_TABLET | Freq: Every day | ORAL | 3 refills | Status: DC

## 2023-09-24 NOTE — Progress Notes (Unsigned)
Established Patient Office Visit  Subjective   Patient ID: Alan Hodges, male    DOB: Dec 24, 1945  Age: 78 y.o. MRN: [id number]  No chief complaint on file.   HPI  {History (Optional):23778} Alan Hodges has history of hypertension, ascending aortic aneurysm, osteoarthritis, prostate cancer.  Generally doing well.  We recently reinitiated amlodipine 2.5 mg daily.  He sent in log of readings recently still has had fairly frequent readings over 140 systolic.  He is not opposed to going up to 5 mg at this time.  At 1 point took lisinopril and amlodipine but blood pressure seemed to improve.  He has ascending aortic aneurysm and has scheduled follow-up scan in February in [place].  He has known atherosclerosis from previous imaging and we had recommended he consider rosuvastatin and these currently taking 10 mg daily without any side effects.  No myalgias.  Due for follow-up lipids and lipid.  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   Shoulder impingement syndrome, left 07/01/2012   Past Surgical History:  Procedure Laterality Date   BUNIONECTOMY  1994   right   cataracts Bilateral    CERVICAL FUSION  1991   COLONOSCOPY     x2   KNEE ARTHROSCOPY  19,14,78   right kneex3   lefr arthroscopy     open x 2   PROSTATE BIOPSY     x 2   RADIOACTIVE SEED IMPLANT N/A 09/28/2019   Procedure: RADIOACTIVE SEED IMPLANT/BRACHYTHERAPY IMPLANT;  Surgeon: Marcine Matar, MD;  Location: [facility] [place];  Service: Urology;  Laterality: N/A;  90 MINS   SHOULDER SURGERY Left 2008   SPACE OAR INSTILLATION N/A 09/28/2019    Procedure: SPACE OAR INSTILLATION;  Surgeon: Marcine Matar, MD;  Location: [facility];  Service: Urology;  Laterality: N/A;    reports that he has never smoked. He has never used smokeless tobacco. He reports current alcohol use. He reports that he does not use drugs. family history includes Diabetes in his father; Hypertension in his mother. No Known Allergies  Review of Systems  Constitutional:  Negative for malaise/fatigue.  Eyes:  Negative for blurred vision.  Respiratory:  Negative for shortness of breath.   Cardiovascular:  Negative for chest pain.  Neurological:  Negative for dizziness, weakness and headaches.      Objective:     BP (!) 140/80 (BP Location: Left Arm, Patient Position: Sitting, Cuff Size: Normal)   Pulse 65   Temp (!) 97.5 F (36.4 C) (Oral)   Wt 183 lb 8 oz (83.2 kg)   SpO2 98%   BMI 24.89 kg/m  BP Readings from Last 3 Encounters:  09/24/23 (!) 140/80  07/02/23 (!) 150/80  06/17/22 120/74   Wt Readings from Last 3 Encounters:  09/24/23 183 lb 8 oz (83.2 kg)  07/02/23 180 lb (81.6 kg)  06/17/22 183 lb 12.8 oz (83.4 kg)      Physical Exam Vitals reviewed.  Constitutional:      Appearance: He is well-developed.  HENT:     Right Ear: External ear normal.     Left  Ear: External ear normal.  Eyes:     Pupils: Pupils are equal, round, and reactive to light.  Neck:     Thyroid: No thyromegaly.  Cardiovascular:     Rate and Rhythm: Normal rate and regular rhythm.  Pulmonary:     Effort: Pulmonary effort is normal. No respiratory distress.     Breath sounds: Normal breath sounds. No wheezing or rales.  Musculoskeletal:     Cervical back: Neck supple.     Right lower leg: No edema.     Left lower leg: No edema.  Neurological:     Mental Status: He is alert and oriented to person, place, and time.      Results for orders placed or performed in visit on 09/24/23  Hepatic Function Panel  Result Value Ref Range    Total Bilirubin 0.8 0.2 - 1.2 mg/dL   Bilirubin, Direct 0.2 0.0 - 0.3 mg/dL   Alkaline Phosphatase 75 39 - 117 U/L   AST 27 0 - 37 U/L   ALT 34 0 - 53 U/L   Total Protein 7.2 6.0 - 8.3 g/dL   Albumin 4.3 3.5 - 5.2 g/dL  Lipid Panel  Result Value Ref Range   Cholesterol 142 0 - 200 mg/dL   Triglycerides 161.0 0.0 - 149.0 mg/dL   HDL 96.04 >54.09 mg/dL   VLDL 81.1 0.0 - 91.4 mg/dL   LDL Cholesterol 64 0 - 99 mg/dL   Total CHOL/HDL Ratio 3    NonHDL 85.95     {Labs (Optional):23779}  The 10-year ASCVD risk score (Arnett DK, et al., 2019) is: 28.4%    Assessment & Plan:   #1 hypertension.  Blood pressure some improved after starting amlodipine but still has several readings over 140 systolic.  Similar reading here today.  Bump amlodipine up to 5 mg daily.  Continue low-sodium diet.  Continue close monitoring.  He has done excellent job monitoring this himself at home  #2 hyperlipidemia.  Recent initiation of rosuvastatin 10 mg daily.  Check lipid and hepatic panel today  #3 ascending aortic aneurysm.  Will need annual imaging.  He has been scheduled for repeat imaging in February.   No follow-ups on file.    Evelena Peat, MD

## 2023-10-01 ENCOUNTER — Other Ambulatory Visit: Payer: Self-pay | Admitting: Family Medicine

## 2023-10-25 DIAGNOSIS — I7121 Aneurysm of the ascending aorta, without rupture: Secondary | ICD-10-CM | POA: Diagnosis not present

## 2023-11-08 ENCOUNTER — Telehealth: Payer: Self-pay

## 2023-11-08 NOTE — Telephone Encounter (Signed)
 Copied from CRM [id number]. Topic: Clinical - Lab/Test Results >> Nov 08, 2023 11:13 AM Alan Hodges wrote: Reason for CRM: Patient is calling to see if Dr. Caryl Never received the results from the MRI angiogram that was ordered, patient states he had test done at [facility] in [place] and wanted to know if they sent the report yet so he can know if anything is wrong, please reach out to patient, thanks.  Alan Hodges [phone]

## 2023-11-08 NOTE — Telephone Encounter (Signed)
 Left a message for the patient to return my call.

## 2023-11-09 ENCOUNTER — Telehealth: Payer: Self-pay

## 2023-11-09 NOTE — Telephone Encounter (Signed)
 Copied from CRM [id number]. Topic: General - Other >> Nov 09, 2023 10:46 AM Turkey A wrote: Reason for CRM: Patient returned call for "Mikayle" agent called [facility] but staff was assisting patient at the moment -please call back

## 2023-11-09 NOTE — Telephone Encounter (Signed)
 Pt is returning mykal call

## 2023-11-09 NOTE — Telephone Encounter (Signed)
 Please see previous encounter

## 2023-11-10 NOTE — Telephone Encounter (Signed)
 Patient informed of the message below and voiced understanding

## 2023-11-11 ENCOUNTER — Encounter: Payer: Self-pay | Admitting: Family Medicine

## 2023-12-28 DIAGNOSIS — C61 Malignant neoplasm of prostate: Secondary | ICD-10-CM | POA: Diagnosis not present

## 2024-01-06 DIAGNOSIS — C61 Malignant neoplasm of prostate: Secondary | ICD-10-CM | POA: Diagnosis not present

## 2024-01-06 DIAGNOSIS — N400 Enlarged prostate without lower urinary tract symptoms: Secondary | ICD-10-CM | POA: Diagnosis not present

## 2024-03-03 NOTE — Progress Notes (Addendum)
 [facility] Quality Team Note  Name: Alan Hodges Date of Birth: Jul 05, 1946 MRN: [id number] Date: 03/03/2024  [facility] Quality Team has reviewed this patient's chart, please see recommendations below:  [facility] Quality Other; (CHART REVIEWED FOR CONTROLLING BLOOD PRESSURE. LAST BP NOT COMPLIANT. NO UPCOMING APPTS)  Chart reviewed for CBP- out of range 07/11/2024  09/18/2024- no cbp to close 2025

## 2024-03-29 ENCOUNTER — Other Ambulatory Visit: Payer: Self-pay | Admitting: Family Medicine

## 2024-06-29 ENCOUNTER — Other Ambulatory Visit: Payer: Self-pay | Admitting: Family Medicine

## 2024-07-11 ENCOUNTER — Ambulatory Visit: Payer: Self-pay | Admitting: Family Medicine

## 2024-07-11 ENCOUNTER — Ambulatory Visit: Admitting: Family Medicine

## 2024-07-11 ENCOUNTER — Encounter: Payer: Self-pay | Admitting: Family Medicine

## 2024-07-11 VITALS — BP 150/80 | HR 58 | Temp 97.6°F | Wt 183.3 lb

## 2024-07-11 DIAGNOSIS — I10 Essential (primary) hypertension: Secondary | ICD-10-CM

## 2024-07-11 DIAGNOSIS — R3912 Poor urinary stream: Secondary | ICD-10-CM | POA: Diagnosis not present

## 2024-07-11 DIAGNOSIS — E785 Hyperlipidemia, unspecified: Secondary | ICD-10-CM | POA: Diagnosis not present

## 2024-07-11 DIAGNOSIS — C61 Malignant neoplasm of prostate: Secondary | ICD-10-CM | POA: Diagnosis not present

## 2024-07-11 DIAGNOSIS — R350 Frequency of micturition: Secondary | ICD-10-CM | POA: Diagnosis not present

## 2024-07-11 DIAGNOSIS — N401 Enlarged prostate with lower urinary tract symptoms: Secondary | ICD-10-CM | POA: Diagnosis not present

## 2024-07-11 LAB — LIPID PANEL
Cholesterol: 148 mg/dL (ref 0–200)
HDL: 58.5 mg/dL (ref >39.00)
LDL Cholesterol: 72 mg/dL (ref 0–99)
NonHDL: 89.84
Total CHOL/HDL Ratio: 3
Triglycerides: 88 mg/dL (ref 0.0–149.0)
VLDL: 17.6 mg/dL (ref 0.0–40.0)

## 2024-07-11 LAB — COMPREHENSIVE METABOLIC PANEL WITH GFR
ALT: 27 U/L (ref 0–53)
AST: 25 U/L (ref 0–37)
Albumin: 4.2 g/dL (ref 3.5–5.2)
Alkaline Phosphatase: 70 U/L (ref 39–117)
BUN: 17 mg/dL (ref 6–23)
CO2: 28 meq/L (ref 19–32)
Calcium: 9 mg/dL (ref 8.4–10.5)
Chloride: 104 meq/L (ref 96–112)
Creatinine, Ser: 0.95 mg/dL (ref 0.40–1.50)
GFR: 77.86 mL/min (ref >60.00)
Glucose, Bld: 97 mg/dL (ref 70–99)
Potassium: 4.1 meq/L (ref 3.5–5.1)
Sodium: 138 meq/L (ref 135–145)
Total Bilirubin: 0.8 mg/dL (ref 0.2–1.2)
Total Protein: 7.3 g/dL (ref 6.0–8.3)

## 2024-07-11 NOTE — Progress Notes (Signed)
 Established Patient Office Visit  Subjective   Patient ID: Alan Hodges, male    DOB: 1946-03-12  Age: 78 y.o. MRN: [id number]  Chief Complaint  Patient presents with   Medication Consultation    HPI   Alan Hodges is seen for medical follow-up.  Generally doing well.  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  Very health-conscious.  He has medical problems including history of prostate cancer, hypertension, ascending aortic aneurysm, osteoarthritis of both knees, hyperlipidemia  Current medications include amlodipine  5 mg daily and rosuvastatin  10 mg daily.  He suspects whitecoat syndrome.  Blood pressures consistently well-controlled at home.  Recent home blood pressure reading 117/78 which is very representative of his usual readings.  Denies any recent headaches or peripheral edema.  No dyspnea.  Has already received flu vaccine this season and has previously received RSV vaccine.  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   Past Surgical History:  Procedure Laterality Date   BUNIONECTOMY  1994   right   cataracts Bilateral    CERVICAL FUSION  1991   COLONOSCOPY     x2   KNEE ARTHROSCOPY  10,06,04   right kneex3   lefr arthroscopy     open x 2   PROSTATE BIOPSY     x 2   RADIOACTIVE SEED IMPLANT N/A 09/28/2019   Procedure: RADIOACTIVE SEED IMPLANT/BRACHYTHERAPY IMPLANT;  Surgeon: Matilda Senior, MD;  Location: [facility];  Service: Urology;  Laterality: N/A;  90 MINS    SHOULDER SURGERY Left 2008   SPACE OAR INSTILLATION N/A 09/28/2019   Procedure: SPACE OAR INSTILLATION;  Surgeon: Matilda Senior, MD;  Location: [facility];  Service: Urology;  Laterality: N/A;    reports that he has never smoked. He has never used smokeless tobacco. He reports current alcohol use. He reports that he does not use drugs. family history includes Diabetes in his father; Hypertension in his mother. No Known Allergies  Review of Systems  Constitutional:  Negative for malaise/fatigue.  Eyes:  Negative for blurred vision.  Respiratory:  Negative for shortness of breath.   Cardiovascular:  Negative for chest pain.  Neurological:  Negative for dizziness, weakness and headaches.      Objective:     BP (!) 150/80 (BP Location: Right Arm, Cuff Size: Normal)   Pulse (!) 58   Temp 97.6 F (36.4 C) (Oral)   Wt 183 lb 4.8 oz (83.1 kg)   SpO2 98%   BMI 24.86 kg/m  BP Readings from Last 3 Encounters:  07/11/24 (!) 150/80  09/24/23 (!) 140/80  07/02/23 (!) 150/80   Wt Readings from Last 3 Encounters:  07/11/24 183 lb 4.8 oz (83.1 kg)  09/24/23 183 lb 8 oz (83.2 kg)  07/02/23 180 lb (81.6 kg)      Physical Exam Vitals reviewed.  Constitutional:      General: He is not in acute distress.    Appearance: He is well-developed.  HENT:  Right Ear: External ear normal.     Left Ear: External ear normal.  Eyes:     Pupils: Pupils are equal, round, and reactive to light.  Neck:     Thyroid : No thyromegaly.  Cardiovascular:     Rate and Rhythm: Regular rhythm.     Comments: Regular rhythm with rate around 58 Pulmonary:     Effort: Pulmonary effort is normal. No respiratory distress.     Breath sounds: Normal breath sounds. No wheezing or rales.  Musculoskeletal:     Cervical back: Neck supple.  Neurological:     Mental Status: He is alert and oriented to person, place, and time.      No results found for any visits on 07/11/24.  Last  CBC Lab Results  Component Value Date   WBC 6.3 06/17/2022   HGB 14.4 06/17/2022   HCT 42.1 06/17/2022   MCV 94.1 06/17/2022   MCH 32.6 05/29/2020   RDW 13.0 06/17/2022   PLT 254.0 06/17/2022   Last metabolic panel Lab Results  Component Value Date   GLUCOSE 110 (H) 07/02/2023   NA 140 07/02/2023   K 3.9 07/02/2023   CL 105 07/02/2023   CO2 28 07/02/2023   BUN 19 07/02/2023   CREATININE 0.96 07/02/2023   GFR 77.45 07/02/2023   CALCIUM  9.1 07/02/2023   PROT 7.2 09/24/2023   ALBUMIN 4.3 09/24/2023   BILITOT 0.8 09/24/2023   ALKPHOS 75 09/24/2023   AST 27 09/24/2023   ALT 34 09/24/2023   ANIONGAP 7 09/25/2019   Last lipids Lab Results  Component Value Date   CHOL 142 09/24/2023   HDL 55.70 09/24/2023   LDLCALC 64 09/24/2023   LDLDIRECT 140.3 04/10/2013   TRIG 108.0 09/24/2023   CHOLHDL 3 09/24/2023      The 89-bzjm ASCVD risk score (Arnett DK, et al., 2019) is: 33.7%    Assessment & Plan:    #1 hypertension.  Slightly elevated today but well-controlled by home readings.  Suspected element of whitecoat syndrome.  He has both manual and automated cuff and is getting similar readings with both.  Continue amlodipine  5 mg daily.  Continue low-sodium diet  #2 hyperlipidemia treated with rosuvastatin  10 mg daily.  Check lipid and hepatic panel today.  Continue low saturated fat diet  No follow-ups on file.    Wolm Scarlet, MD

## 2024-07-31 DIAGNOSIS — H43813 Vitreous degeneration, bilateral: Secondary | ICD-10-CM | POA: Diagnosis not present

## 2024-07-31 DIAGNOSIS — Z961 Presence of intraocular lens: Secondary | ICD-10-CM | POA: Diagnosis not present

## 2024-07-31 DIAGNOSIS — Z83518 Family history of other specified eye disorder: Secondary | ICD-10-CM | POA: Diagnosis not present

## 2024-07-31 DIAGNOSIS — H35363 Drusen (degenerative) of macula, bilateral: Secondary | ICD-10-CM | POA: Diagnosis not present

## 2024-08-22 ENCOUNTER — Ambulatory Visit: Admitting: Family Medicine

## 2024-09-28 ENCOUNTER — Other Ambulatory Visit: Payer: Self-pay | Admitting: Family Medicine
